# Patient Record
Sex: Female | Born: 1980
Health system: Southern US, Community
[De-identification: ages and names within clinical notes are randomized; demographics above are authoritative.]

## PROBLEM LIST (undated history)

## (undated) DIAGNOSIS — F329 Major depressive disorder, single episode, unspecified: Secondary | ICD-10-CM

## (undated) DIAGNOSIS — F53 Postpartum depression: Secondary | ICD-10-CM

## (undated) DIAGNOSIS — F32A Depression, unspecified: Secondary | ICD-10-CM

## (undated) DIAGNOSIS — O24419 Gestational diabetes mellitus in pregnancy, unspecified control: Secondary | ICD-10-CM

## (undated) DIAGNOSIS — Z9189 Other specified personal risk factors, not elsewhere classified: Secondary | ICD-10-CM

## (undated) DIAGNOSIS — O99345 Other mental disorders complicating the puerperium: Secondary | ICD-10-CM

## (undated) DIAGNOSIS — F1910 Other psychoactive substance abuse, uncomplicated: Secondary | ICD-10-CM

## (undated) DIAGNOSIS — E039 Hypothyroidism, unspecified: Secondary | ICD-10-CM

## (undated) DIAGNOSIS — Z5900 Homelessness unspecified: Secondary | ICD-10-CM

## (undated) DIAGNOSIS — Z62810 Personal history of physical and sexual abuse in childhood: Secondary | ICD-10-CM

## (undated) DIAGNOSIS — Z720 Tobacco use: Secondary | ICD-10-CM

## (undated) DIAGNOSIS — A599 Trichomoniasis, unspecified: Secondary | ICD-10-CM

## (undated) DIAGNOSIS — F1290 Cannabis use, unspecified, uncomplicated: Secondary | ICD-10-CM

## (undated) HISTORY — DX: Trichomoniasis, unspecified: A59.9

## (undated) HISTORY — DX: Hypothyroidism, unspecified: E03.9

## (undated) HISTORY — PX: NO PAST SURGERIES: SHX2092

## (undated) HISTORY — DX: Gestational diabetes mellitus in pregnancy, unspecified control: O24.419

---

## 2015-10-24 ENCOUNTER — Emergency Department (HOSPITAL_COMMUNITY)
Admission: EM | Admit: 2015-10-24 | Discharge: 2015-10-26 | Disposition: A | Discharge status: Other Acute Inpatient Hospital | Payer: Medicaid Other | Attending: Emergency Medicine | Admitting: Emergency Medicine

## 2015-10-24 DIAGNOSIS — Z3490 Encounter for supervision of normal pregnancy, unspecified, unspecified trimester: Secondary | ICD-10-CM

## 2015-10-24 DIAGNOSIS — R45851 Suicidal ideations: Secondary | ICD-10-CM

## 2015-10-24 DIAGNOSIS — Z3A11 11 weeks gestation of pregnancy: Secondary | ICD-10-CM | POA: Insufficient documentation

## 2015-10-24 DIAGNOSIS — F333 Major depressive disorder, recurrent, severe with psychotic symptoms: Secondary | ICD-10-CM | POA: Diagnosis present

## 2015-10-24 DIAGNOSIS — Z79899 Other long term (current) drug therapy: Secondary | ICD-10-CM | POA: Diagnosis not present

## 2015-10-24 DIAGNOSIS — O99341 Other mental disorders complicating pregnancy, first trimester: Secondary | ICD-10-CM | POA: Diagnosis not present

## 2015-10-24 DIAGNOSIS — F323 Major depressive disorder, single episode, severe with psychotic features: Secondary | ICD-10-CM | POA: Insufficient documentation

## 2015-10-24 DIAGNOSIS — F17200 Nicotine dependence, unspecified, uncomplicated: Secondary | ICD-10-CM | POA: Insufficient documentation

## 2015-10-24 HISTORY — DX: Major depressive disorder, single episode, unspecified: F32.9

## 2015-10-24 HISTORY — DX: Depression, unspecified: F32.A

## 2015-10-24 NOTE — ED Notes (Signed)
Bed: WTR5 Expected date:  Expected time:  Means of arrival:  Comments: 

## 2015-10-25 ENCOUNTER — Emergency Department (HOSPITAL_COMMUNITY): Payer: Medicaid Other

## 2015-10-25 ENCOUNTER — Encounter (HOSPITAL_COMMUNITY): Payer: Self-pay | Admitting: Emergency Medicine

## 2015-10-25 DIAGNOSIS — F333 Major depressive disorder, recurrent, severe with psychotic symptoms: Secondary | ICD-10-CM | POA: Diagnosis present

## 2015-10-25 LAB — COMPREHENSIVE METABOLIC PANEL
ALBUMIN: 3.9 g/dL (ref 3.5–5.0)
ALT: 25 U/L (ref 14–54)
AST: 40 U/L (ref 15–41)
Alkaline Phosphatase: 68 U/L (ref 38–126)
Anion gap: 8 (ref 5–15)
BILIRUBIN TOTAL: 0.7 mg/dL (ref 0.3–1.2)
BUN: 9 mg/dL (ref 6–20)
CO2: 23 mmol/L (ref 22–32)
CREATININE: 0.63 mg/dL (ref 0.44–1.00)
Calcium: 8.9 mg/dL (ref 8.9–10.3)
Chloride: 104 mmol/L (ref 101–111)
GFR calc Af Amer: >60 mL/min (ref >60)
GFR calc non Af Amer: >60 mL/min (ref >60)
GLUCOSE: 129 mg/dL — AB (ref 65–99)
POTASSIUM: 3 mmol/L — AB (ref 3.5–5.1)
Sodium: 135 mmol/L (ref 135–145)
TOTAL PROTEIN: 7.8 g/dL (ref 6.5–8.1)

## 2015-10-25 LAB — SALICYLATE LEVEL: Salicylate Lvl: <4 mg/dL (ref 2.8–30.0)

## 2015-10-25 LAB — CBC
HEMATOCRIT: 34.5 % — AB (ref 36.0–46.0)
Hemoglobin: 11.6 g/dL — ABNORMAL LOW (ref 12.0–15.0)
MCH: 29.1 pg (ref 26.0–34.0)
MCHC: 33.6 g/dL (ref 30.0–36.0)
MCV: 86.7 fL (ref 78.0–100.0)
Platelets: 267 10*3/uL (ref 150–400)
RBC: 3.98 MIL/uL (ref 3.87–5.11)
RDW: 12.7 % (ref 11.5–15.5)
WBC: 9.6 10*3/uL (ref 4.0–10.5)

## 2015-10-25 LAB — PREGNANCY, URINE: PREG TEST UR: POSITIVE — AB

## 2015-10-25 LAB — RAPID URINE DRUG SCREEN, HOSP PERFORMED
AMPHETAMINES: NOT DETECTED
BARBITURATES: NOT DETECTED
BENZODIAZEPINES: NOT DETECTED
Cocaine: NOT DETECTED
Opiates: NOT DETECTED
Tetrahydrocannabinol: POSITIVE — AB

## 2015-10-25 LAB — ACETAMINOPHEN LEVEL: Acetaminophen (Tylenol), Serum: <10 ug/mL — ABNORMAL LOW (ref 10–30)

## 2015-10-25 LAB — HCG, QUANTITATIVE, PREGNANCY: hCG, Beta Chain, Quant, S: 40380 m[IU]/mL — ABNORMAL HIGH (ref <5)

## 2015-10-25 LAB — ETHANOL: Alcohol, Ethyl (B): <5 mg/dL (ref <5)

## 2015-10-25 MED ORDER — ACETAMINOPHEN 325 MG PO TABS: 650.0000 mg | ORAL_TABLET | ORAL | Status: DC | PRN

## 2015-10-25 MED ORDER — HYDROXYZINE HCL 10 MG PO TABS
10.0000 mg | ORAL_TABLET | Freq: Three times a day (TID) | ORAL | Status: DC | PRN
  Filled 2015-10-25: qty 1

## 2015-10-25 MED ORDER — LORAZEPAM 1 MG PO TABS: 1.0000 mg | ORAL_TABLET | Freq: Three times a day (TID) | ORAL | Status: DC | PRN

## 2015-10-25 MED ORDER — NICOTINE 21 MG/24HR TD PT24: 21.0000 mg | MEDICATED_PATCH | Freq: Every day | TRANSDERMAL | Status: DC

## 2015-10-25 MED ORDER — FLUOXETINE HCL 10 MG PO CAPS
10.0000 mg | ORAL_CAPSULE | Freq: Every day | ORAL | Status: DC
  Administered 2015-10-25: 10 mg via ORAL
  Filled 2015-10-25: qty 1

## 2015-10-25 NOTE — Progress Notes (Signed)
Entered in d/c instructions Please use the resources provided to you in emergency room by case manager to assist you're your choice of doctor for follow up     These [place] uninsured resources provide possible primary care providers, resources for discounted medications, housing, dental resources, affordable care act information, plus other resources for [place]     Next Steps: Schedule an appointment as soon as possible for a visit today

## 2015-10-25 NOTE — ED Notes (Signed)
[facility] [employer] [facility]. Pt had a knife with her. The knife is locked up with security

## 2015-10-25 NOTE — Progress Notes (Addendum)
Pt was brought back from the main ER requesting gigngerale and crackers. Report to oncoming shift. (7;15pm)

## 2015-10-25 NOTE — Progress Notes (Signed)
Pt appears very anxious. She requested a sandwich and soda to drink. Pt has passive SI and remains with a sitter.She admitted she has been hearing voices telling her to hurt herself. Pt does contract for safety.

## 2015-10-25 NOTE — ED Provider Notes (Signed)
[facility]-EMERGENCY DEPT Provider Note   CSN: [id number] Arrival date & time: 10/24/15  2359     History   Chief Complaint Chief Complaint  Patient presents with  . Suicidal  . Medical Clearance    HPI Salley Hewsdonya Kulig is a 35 y.o. female.  The patient arrives from home by EMS asking for help for suicidal thoughts. She reports she becomes depressed and feels suicidal. No attempt to self harm today. She reports hearing and seeing hallucinations. No command voices. She reports being off her psychiatric medications for the past several months.    The history is provided by the patient. No language interpreter was used.    Past Medical History:  Diagnosis Date  . Depression     There are no active problems to display for this patient.   History reviewed. No pertinent surgical history.  OB History    No data available       Home Medications    Prior to Admission medications   Medication Sig Start Date End Date Taking? Authorizing Provider  PRESCRIPTION MEDICATION Patient states that she has not taken any medication in seven months or more. States that she was previously prescribed Zoloft 100 mg daily, Prozac 20 daily, Synthroid 100 mcg daily, metformin 500 twice daily, and trazodone 150 mg at bedtime.   Yes Historical Provider, MD    Family History No family history on file.  Social History Social History  Substance Use Topics  . Smoking status: Current Every Day Smoker  . Smokeless tobacco: Never Used  . Alcohol use No     Allergies   Review of patient's allergies indicates no known allergies.   Review of Systems Review of Systems  Constitutional: Negative for chills and fever.  HENT: Negative.   Respiratory: Negative.   Cardiovascular: Negative.   Gastrointestinal: Negative.   Musculoskeletal: Negative.   Skin: Negative.   Neurological: Negative.   Psychiatric/Behavioral: Positive for hallucinations and suicidal ideas.     Physical Exam Updated  Vital Signs BP 107/63 (BP Location: Right Arm)   Pulse 77   Temp 98.7 F (37.1 C) (Oral)   SpO2 99%   Physical Exam  Constitutional: She is oriented to person, place, and time. She appears well-developed and well-nourished.  Somnolent but easily arousable.  HENT:  Head: Normocephalic.  Neck: Normal range of motion. Neck supple.  Cardiovascular: Normal rate and regular rhythm.   Pulmonary/Chest: Effort normal and breath sounds normal.  Abdominal: Soft. Bowel sounds are normal. There is no tenderness. There is no rebound and no guarding.  Musculoskeletal: Normal range of motion.  Neurological: She is alert and oriented to person, place, and time.  Skin: Skin is warm and dry. No rash noted.  Psychiatric: Her speech is delayed. She is slowed and actively hallucinating. She exhibits a depressed mood. She expresses suicidal ideation.     ED Treatments / Results  Labs (all labs ordered are listed, but only abnormal results are displayed) Labs Reviewed  COMPREHENSIVE METABOLIC PANEL - Abnormal; Notable for the following:       Result Value   Potassium 3.0 (*)    Glucose, Bld 129 (*)    All other components within normal limits  ACETAMINOPHEN LEVEL - Abnormal; Notable for the following:    Acetaminophen (Tylenol), Serum <10 (*)    All other components within normal limits  CBC - Abnormal; Notable for the following:    Hemoglobin 11.6 (*)    HCT 34.5 (*)    All other  components within normal limits  URINE RAPID DRUG SCREEN, HOSP PERFORMED - Abnormal; Notable for the following:    Tetrahydrocannabinol POSITIVE (*)    All other components within normal limits  ETHANOL  SALICYLATE LEVEL  PREGNANCY, URINE    EKG  EKG Interpretation None       Radiology No results found.  Procedures Procedures (including critical care time)  Medications Ordered in ED Medications  nicotine (NICODERM CQ - dosed in mg/24 hours) patch 21 mg (not administered)  acetaminophen (TYLENOL)  tablet 650 mg (not administered)  LORazepam (ATIVAN) tablet 1 mg (not administered)     Initial Impression / Assessment and Plan / ED Course  I have reviewed the triage vital signs and the nursing notes.  Pertinent labs & imaging results that were available during my care of the patient were reviewed by me and considered in my medical decision making (see chart for details).  Clinical Course    Patient presents with SI and hallucinations. Noncompliant with medications. TTS consultation requested to evaluate for admission vs discharge.   Final Clinical Impressions(s) / ED Diagnoses   Final diagnoses:  None   1. SI 2. hallucinations New Prescriptions New Prescriptions   No medications on file     Elpidio Anis, Cordelia Poche 10/25/15 0257    Zadie Rhine, MD 10/25/15 [phone]

## 2015-10-25 NOTE — BH Assessment (Signed)
Referrals faxed to the following for review for possible inpt placement: [facility], [facility], [facility], [facility], [facility], [address], [address], [facility], [facility], [facility], [facility], [facility].  Princess BruinsAquicha Duff, MSW, Theresia MajorsLCSWA
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
## 2015-10-25 NOTE — ED Notes (Signed)
Security called & informed pt will transported by [facility] to [facility] & has a knife logged in with Security.  Per Security, knife will be given to [facility] upon their arrival to ED to transport with pt's other belongings.

## 2015-10-25 NOTE — BH Assessment (Signed)
[facility] by Dr. Jennet MaduroPucilowska. Patient reviewed and signed voluntary consent to treat to no one. Patient originally stated that she wanted to have her information released to a friend and then stated that she did not want it to be released and revoked the release and checked 'no." [facility]. Patients nurse informed of acceptance and paperwork being signed.   Davina PokeJoVea Jaryan Chicoine, LCSW Therapeutic Triage Specialist [facility] 10/25/2015 10:40 PM
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
## 2015-10-25 NOTE — BH Assessment (Addendum)
[facility] Assessment Progress Note  Per Thedore MinsMojeed Akintayo, MD, this pt requires psychiatric hospitalization at this time.  The following facilities have been contacted to seek placement for this pt, with results as noted:  Beds available, information sent, decision pending:  [facility]   At capacity:  [facility]  Additionally, [facility] call; per Kerry DoryLaurie Gardner, no beds are available at this time.  They do not anticipate having bed open over the next few days, and in fact, have a wait list.  At this time she does not recommend that this writer fax referral information.   Doylene Canninghomas Doyal Saric, MA Triage Specialist [phone]

## 2015-10-25 NOTE — ED Notes (Signed)
PA at bedside.

## 2015-10-25 NOTE — BH Assessment (Signed)
[facility], room [phone]. Please call report to [phone].

## 2015-10-25 NOTE — ED Notes (Signed)
Bed: WA30 Expected date:  Expected time:  Means of arrival:  Comments: 

## 2015-10-25 NOTE — Progress Notes (Signed)
  ED CM left pt uninsured [place] resources in his locker #33 CM spoke with pt who confirms uninsured [place] resident [place] pcp.  CM provided written information to assist pt with determining choice for uninsured accepting pcps, discussed the importance of pcp vs EDP services for f/u care, www.needymeds.org, www.goodrx.com, discounted pharmacies and other [place] resources such as [facility] , [facility], affordable care act, financial assistance, uninsured dental services, [place] med assist, DSS and  health department  Provided resources for [place] uninsured accepting pcps like [facility], family medicine at [address], [facility], [facility], local urgent care centers, [facility], [facility], general medical clinics, family services of the [place], [facility] urgent care plus others, medication resources, [facility] out patient pharmacies and housing Provided [employer] contact information

## 2015-10-25 NOTE — ED Notes (Signed)
Velna HatchetSheila from lab called about quantitative pregnancy lab.  She will run from urine that is in the lab.

## 2015-10-25 NOTE — ED Provider Notes (Signed)
Pt signed out to me at shift change pending Pelvic OB us. Pt has no pregnancy related complaints.   9:03 AM  US shows single intrauterine live pregnancy estimated at 11wks 0 days. Discussed results with pt. Continues to have no related symptoms. She is in NAD. Sleeping. Easily arousable. No abdominal tenderness. No vaginal discharge or bleeding. Cleared for psychiatric placement   Vitals:   10/25/15 0015 10/25/15 0633  BP: 107/63 114/67  Pulse: 77 (!) 56  Resp:  20  Temp: 98.7 F (37.1 C) 98.4 F (36.9 C)      Jaynie Crumbleatyana Zayed Griffie, PA-C 10/25/15 0913    Linwood DibblesJon Knapp, MD 10/27/15 [phone]

## 2015-10-25 NOTE — ED Notes (Signed)
[id number] -  Patient and belongings wanded by security.

## 2015-10-26 ENCOUNTER — Inpatient Hospital Stay
Admit: 2015-10-26 | Discharge: 2015-11-03 | DRG: 885 | Disposition: A | Discharge status: Home / Self Care | Payer: Medicaid Other | Attending: Psychiatry | Admitting: Psychiatry

## 2015-10-26 DIAGNOSIS — G4700 Insomnia, unspecified: Secondary | ICD-10-CM | POA: Diagnosis present

## 2015-10-26 DIAGNOSIS — Z3A00 Weeks of gestation of pregnancy not specified: Secondary | ICD-10-CM

## 2015-10-26 DIAGNOSIS — Z331 Pregnant state, incidental: Secondary | ICD-10-CM | POA: Diagnosis not present

## 2015-10-26 DIAGNOSIS — F17210 Nicotine dependence, cigarettes, uncomplicated: Secondary | ICD-10-CM | POA: Diagnosis present

## 2015-10-26 DIAGNOSIS — F4310 Post-traumatic stress disorder, unspecified: Secondary | ICD-10-CM | POA: Diagnosis present

## 2015-10-26 DIAGNOSIS — R45851 Suicidal ideations: Secondary | ICD-10-CM | POA: Diagnosis present

## 2015-10-26 DIAGNOSIS — O99320 Drug use complicating pregnancy, unspecified trimester: Secondary | ICD-10-CM | POA: Diagnosis present

## 2015-10-26 DIAGNOSIS — F1290 Cannabis use, unspecified, uncomplicated: Secondary | ICD-10-CM | POA: Diagnosis present

## 2015-10-26 DIAGNOSIS — Z590 Homelessness: Secondary | ICD-10-CM | POA: Diagnosis not present

## 2015-10-26 DIAGNOSIS — E039 Hypothyroidism, unspecified: Secondary | ICD-10-CM | POA: Diagnosis present

## 2015-10-26 DIAGNOSIS — Z79899 Other long term (current) drug therapy: Secondary | ICD-10-CM | POA: Diagnosis not present

## 2015-10-26 DIAGNOSIS — O99330 Smoking (tobacco) complicating pregnancy, unspecified trimester: Secondary | ICD-10-CM | POA: Diagnosis present

## 2015-10-26 DIAGNOSIS — F89 Unspecified disorder of psychological development: Secondary | ICD-10-CM | POA: Diagnosis present

## 2015-10-26 DIAGNOSIS — F333 Major depressive disorder, recurrent, severe with psychotic symptoms: Principal | ICD-10-CM | POA: Diagnosis present

## 2015-10-26 DIAGNOSIS — F1220 Cannabis dependence, uncomplicated: Secondary | ICD-10-CM | POA: Diagnosis present

## 2015-10-26 DIAGNOSIS — F17200 Nicotine dependence, unspecified, uncomplicated: Secondary | ICD-10-CM | POA: Diagnosis present

## 2015-10-26 HISTORY — DX: Suicidal ideations: R45.851

## 2015-10-26 LAB — TSH: TSH: 4.988 u[IU]/mL — ABNORMAL HIGH (ref 0.350–4.500)

## 2015-10-26 MED ORDER — ALUM & MAG HYDROXIDE-SIMETH 200-200-20 MG/5ML PO SUSP: 30.0000 mL | ORAL | Status: DC | PRN

## 2015-10-26 MED ORDER — PRENATAL PLUS 27-1 MG PO TABS
1.0000 | ORAL_TABLET | Freq: Every day | ORAL | Status: DC
  Administered 2015-10-26 – 2015-11-03 (×9): 1 via ORAL
  Filled 2015-10-26 (×9): qty 1

## 2015-10-26 MED ORDER — FOLIC ACID 1 MG PO TABS
2.0000 mg | ORAL_TABLET | Freq: Every day | ORAL | Status: DC
  Administered 2015-10-26 – 2015-11-03 (×9): 2 mg via ORAL
  Filled 2015-10-26 (×10): qty 2

## 2015-10-26 MED ORDER — ZOLPIDEM TARTRATE 5 MG PO TABS
5.0000 mg | ORAL_TABLET | Freq: Every evening | ORAL | Status: DC | PRN
Start: 2015-10-26 — End: 2015-11-03

## 2015-10-26 MED ORDER — ACETAMINOPHEN 325 MG PO TABS: 650.0000 mg | ORAL_TABLET | Freq: Four times a day (QID) | ORAL | Status: DC | PRN

## 2015-10-26 MED ORDER — MAGNESIUM HYDROXIDE 400 MG/5ML PO SUSP: 30.0000 mL | Freq: Every day | ORAL | Status: DC | PRN

## 2015-10-26 MED ORDER — LURASIDONE HCL 80 MG PO TABS
40.0000 mg | ORAL_TABLET | Freq: Every day | ORAL | Status: DC
  Administered 2015-10-26: 40 mg via ORAL
  Filled 2015-10-26: qty 1
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
## 2015-10-26 NOTE — Tx Team (Signed)
Initial Treatment Plan 10/26/2015 3:46 AM Mary HewsAdonya Richoux [id number]    PATIENT STRESSORS: Financial difficulties Marital or family conflict Medication change or noncompliance Substance abuse Traumatic event   PATIENT STRENGTHS: Wellsite geologistCommunication skills General fund of knowledge   PATIENT IDENTIFIED PROBLEMS: "I'm angry all the time"  Depression   Polysubstance abuse                  DISCHARGE CRITERIA:  Improved stabilization in mood, thinking, and/or behavior  PRELIMINARY DISCHARGE PLAN: Outpatient therapy  PATIENT/FAMILY INVOLVEMENT: This treatment plan has been presented to and reviewed with the patient, Mary Ramirez, and/or family member.  The patient and family have been given the opportunity to ask questions and make suggestions.  Lendell Capriceasey N Chon Buhl, RN 10/26/2015, 3:46 AM

## 2015-10-26 NOTE — Plan of Care (Signed)
Problem: Safety: Goal: Periods of time without injury will increase Outcome: Progressing Patient has remained free from injury this shift.  Patient provided with a safe environment.  Patient safety maintained with 15 minute checks.   

## 2015-10-26 NOTE — Progress Notes (Signed)
D:  Patient continues to endorse passive SI but verbally contracts for safety.  Patient denies AVH/HI.  Patient continues to be blunted and demanding towards staff. A:  Patient encouraged to attend group sessions.  Patient administered scheduled medications. R:  Patient safety maintained with 15 minute checks.  Patient educated on appropriate behaviors towards staff and peers.
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
## 2015-10-26 NOTE — [facility] Suicide Risk Assessment (Signed)
[place] INPATIENT:  Family/Significant Other Suicide Prevention Education  Suicide Prevention Education:  Education Completed; friend, Emogene Morgan [phone] has been identified by the patient as the family member/significant other with whom the patient will be residing, and identified as the person(s) who will aid the patient in the event of a mental health crisis (suicidal ideations/suicide attempt).  With written consent from the patient, the family member/significant other has been provided the following suicide prevention education, prior to the and/or following the discharge of the patient. Lucrezia Starch stated that she met pt in the park and has been providing her with food and clothes since. 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  The suicide prevention education provided includes the following:  Suicide risk factors  Suicide prevention and interventions  National Suicide Hotline telephone number  [facility] assessment telephone number  [facility] Emergency Assistance [place] and/or Residential Mobile Crisis Unit telephone number  Request made of family/significant other to:  Remove weapons (e.g., guns, rifles, knives), all items previously/currently identified as safety concern.    Remove drugs/medications (over-the-counter, prescriptions, illicit drugs), all items previously/currently identified as a safety concern.  The family member/significant other verbalizes understanding of the suicide prevention education information provided.  The family member/significant other agrees to remove the items of safety concern listed above.  Emilie Rutter, MSW, LCSW-A 10/26/2015, 2:03 PM

## 2015-10-26 NOTE — Tx Team (Signed)
Salley Hewsdonya Barillas  Pt name [id number]    Major depressive disorder, recurrent severe without psychotic features (HCC)  Principal Problem Principal Problem:   Major depressive disorder, recurrent severe without psychotic features (HCC) Active Problems:   Cannabis use disorder, moderate, dependence (HCC)   Suicidal ideation   Pregnant   Tobacco use disorder  Secondary Dx/Hospital Problem List Current Facility-Administered Medications  Medication Dose Route Frequency Provider Last Rate Last Dose  . acetaminophen (TYLENOL) tablet 650 mg  650 mg Oral Q6H PRN Jolanta B Pucilowska, MD      . alum & mag hydroxide-simeth (MAALOX/MYLANTA) 200-200-20 MG/5ML suspension 30 mL  30 mL Oral Q4H PRN Jolanta B Pucilowska, MD      . folic acid (FOLVITE) tablet 2 mg  2 mg Oral Daily Jolanta B Pucilowska, MD   2 mg at 10/26/15 0840  . magnesium hydroxide (MILK OF MAGNESIA) suspension 30 mL  30 mL Oral Daily PRN Jolanta B Pucilowska, MD      . prenatal vitamin w/FE, FA (PRENATAL 1 + 1) 27-1 MG tablet 1 tablet  1 tablet Oral Q1200 Jolanta B Pucilowska, MD   1 tablet at 10/26/15 1200  . zolpidem (AMBIEN) tablet 5 mg  5 mg Oral QHS PRN Shari ProwsJolanta B Pucilowska, MD         Current Meds Prescriptions Prior to Admission  Medication Sig Dispense Refill Last Dose  . PRESCRIPTION MEDICATION Patient states that she has not taken any medication in seven months or more. States that she was previously prescribed Zoloft 100 mg daily, Prozac 20 daily, Synthroid 100 mcg daily, metformin 500 twice daily, and trazodone 150 mg at bedtime.   more than 7 months ago     Prior to Admission Meds   Interdisciplinary Treatment and Diagnostic Plan Update  10/26/2015 Time of Session: 12:06 PM  Salley Hewsdonya Baucum MRN: [id number]  Principal Diagnosis: Major depressive disorder, recurrent severe without psychotic features (HCC)  Secondary Diagnoses: Principal Problem:   Major depressive disorder, recurrent severe without psychotic features  (HCC) Active Problems:   Cannabis use disorder, moderate, dependence (HCC)   Suicidal ideation   Pregnant   Tobacco use disorder   Current Medications:  Current Facility-Administered Medications  Medication Dose Route Frequency Provider Last Rate Last Dose  . acetaminophen (TYLENOL) tablet 650 mg  650 mg Oral Q6H PRN Jolanta B Pucilowska, MD      . alum & mag hydroxide-simeth (MAALOX/MYLANTA) 200-200-20 MG/5ML suspension 30 mL  30 mL Oral Q4H PRN Jolanta B Pucilowska, MD      . folic acid (FOLVITE) tablet 2 mg  2 mg Oral Daily Jolanta B Pucilowska, MD   2 mg at 10/26/15 0840  . magnesium hydroxide (MILK OF MAGNESIA) suspension 30 mL  30 mL Oral Daily PRN Jolanta B Pucilowska, MD      . prenatal vitamin w/FE, FA (PRENATAL 1 + 1) 27-1 MG tablet 1 tablet  1 tablet Oral Q1200 Jolanta B Pucilowska, MD   1 tablet at 10/26/15 1200  . zolpidem (AMBIEN) tablet 5 mg  5 mg Oral QHS PRN Shari ProwsJolanta B Pucilowska, MD        PTA Medications: Prescriptions Prior to Admission  Medication Sig Dispense Refill Last Dose  . PRESCRIPTION MEDICATION Patient states that she has not taken any medication in seven months or more. States that she was previously prescribed Zoloft 100 mg daily, Prozac 20 daily, Synthroid 100 mcg daily, metformin 500 twice daily, and trazodone 150 mg at bedtime.   more than 7  months ago    Treatment Modalities: Medication Management, Group therapy, Case management,  1 to 1 session with clinician, Psychoeducation, Recreational therapy.   Physician Treatment Plan for Primary Diagnosis: Major depressive disorder, recurrent severe without psychotic features (HCC) Long Term Goal(s): Improvement in symptoms so as ready for discharge  Short Term Goals: Ability to identify changes in lifestyle to reduce recurrence of condition will improve, Ability to verbalize feelings will improve, Ability to disclose and discuss suicidal ideas, Ability to demonstrate self-control will improve, Ability to  identify and develop effective coping behaviors will improve, Ability to maintain clinical measurements within normal limits will improve and Compliance with prescribed medications will improve  Medication Management: Evaluate patient's response, side effects, and tolerance of medication regimen.  Therapeutic Interventions: 1 to 1 sessions, Unit Group sessions and Medication administration.  Evaluation of Outcomes: Progressing  Physician Treatment Plan for Secondary Diagnosis: Principal Problem:   Major depressive disorder, recurrent severe without psychotic features (HCC) Active Problems:   Cannabis use disorder, moderate, dependence (HCC)   Suicidal ideation   Pregnant   Tobacco use disorder   Long Term Goal(s): Improvement in symptoms so as ready for discharge  Short Term Goals: Ability to identify changes in lifestyle to reduce recurrence of condition will improve, Ability to verbalize feelings will improve, Ability to disclose and discuss suicidal ideas, Ability to demonstrate self-control will improve, Ability to identify and develop effective coping behaviors will improve, Ability to maintain clinical measurements within normal limits will improve and Compliance with prescribed medications will improve  Medication Management: Evaluate patient's response, side effects, and tolerance of medication regimen.  Therapeutic Interventions: 1 to 1 sessions, Unit Group sessions and Medication administration.  Evaluation of Outcomes: Progressing   RN Treatment Plan for Primary Diagnosis: Major depressive disorder, recurrent severe without psychotic features (HCC) Long Term Goal(s): Knowledge of disease and therapeutic regimen to maintain health will improve  Short Term Goals: Ability to remain free from injury will improve, Ability to verbalize frustration and anger appropriately will improve, Ability to disclose and discuss suicidal ideas, Ability to identify and develop effective coping  behaviors will improve and Compliance with prescribed medications will improve  Medication Management: RN will administer medications as ordered by provider, will assess and evaluate patient's response and provide education to patient for prescribed medication. RN will report any adverse and/or side effects to prescribing provider.  Therapeutic Interventions: 1 on 1 counseling sessions, Psychoeducation, Medication administration, Evaluate responses to treatment, Monitor vital signs and CBGs as ordered, Perform/monitor CIWA, COWS, AIMS and Fall Risk screenings as ordered, Perform wound care treatments as ordered.  Evaluation of Outcomes: Progressing   LCSW Treatment Plan for Primary Diagnosis: Major depressive disorder, recurrent severe without psychotic features (HCC) Long Term Goal(s): Safe transition to appropriate next level of care at discharge, Engage patient in therapeutic group addressing interpersonal concerns.  Short Term Goals: Engage patient in aftercare planning with referrals and resources, Increase social support, Identify triggers associated with mental health/substance abuse issues and Increase skills for wellness and recovery  Therapeutic Interventions: Assess for all discharge needs, 1 to 1 time with Social worker, Explore available resources and support systems, Assess for adequacy in community support network, Educate family and significant other(s) on suicide prevention, Complete Psychosocial Assessment, Interpersonal group therapy.  Evaluation of Outcomes: Progressing   Progress in Treatment: Attending groups: Yes Participating in groups: Yes Taking medication as prescribed: Yes, MD continues to assess for medication changes as needed Toleration medication: Yes, no side effects reported  at this time Family/Significant other contact made:  Patient understands diagnosis:  Discussing patient identified problems/goals with staff: Yes Medical problems stabilized or  resolved: Yes Denies suicidal/homicidal ideation:  Issues/concerns per patient self-inventory: None Other: N/A  New problem(s) identified: None identified at this time.   New Short Term/Long Term Goal(s): None identified at this time.   Discharge Plan or Barriers:   Reason for Continuation of Hospitalization: Anxiety Delusions  Depression Hallucinations Homicidal ideation [place] [facility] Issues Medication stabilization Suicidal ideation Withdrawal symptoms  Estimated Length of Stay: 3-5 days  Attendees: Patient: Nigel Wessman  10/26/2015  12:06 PM  Physician: Dr. Kristine Linea, MD 10/26/2015  12:06 PM  Nursing: Hulan Amato, RN 10/26/2015  12:06 PM  RN Care Manager: Gilman Buttner  10/26/2015  12:06 PM  Social Worker: Hampton Abbot, MSW, LCSW-A 10/26/2015  12:06 PM  Recreational Therapist: Hershal Coria, LRT 10/26/2015  12:06 PM  Other: Jake Shark, LCSW 10/26/2015  12:06 PM  Other: York Grice, LCSW-A, LCAS 10/26/2015  12:06 PM  Other: 10/26/2015  12:06 PM    Scribe for Treatment Team: Lynden Oxford, MSW, LCSW-A

## 2015-10-26 NOTE — Progress Notes (Signed)
Recreation Therapy Notes  Date: 09.26.17 Time: 9:30 am Location: Craft Room  Group Topic: Self-expression  Goal Area(s) Addresses:  Patient will be able to identify a color that represents each emotion. Patient will verbalize benefit of using art as a means of self-expression. Patient will verbalize one emotion experienced while participating in activity.  Behavioral Response: Attentive, Interactive  Intervention: The Colors Within Me  Activity: Patients were given a blank face worksheet and were instructed to pick a color for each emotion they were feeling and show on the worksheet how much of that emotion they were feeling.  Education: LRT educated patients on different forms of self-expression.  Education Outcome: In group clarification offered  Clinical Observations/Feedback: Patient completed activity by picking colors for each emotion she felt and showing how much of that emotion she felt. Patient contributed to group discussion by stating how her emotions affect her treatment in the hospital, that her emotions are dynamic and what changes them, and how she sees her emotions changing when she is d/c. Towards the end of group, patient was focused on how her past and being homeless affect her emotions, but it wasn't her fault with the situation that she is in.  Jacquelynn CreeGreene,Joslyne Marshburn M, LRT/CTRS 10/26/2015 10:22 AM

## 2015-10-26 NOTE — ED Notes (Signed)
[place] arrived on unit.

## 2015-10-26 NOTE — [facility] Group Notes (Signed)
[facility] LCSW Group Therapy   10/26/2015 1pm  Type of Therapy: Group Therapy   Participation Level: Pt invited but did not attend.   Participation Quality: Pt invited but did not attend.    Hampton AbbotKadijah Broderick Fonseca, MSW, LCSWA 10/26/2015, 2:48PM
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
## 2015-10-27 LAB — LIPID PANEL
CHOL/HDL RATIO: 2.7 ratio
CHOLESTEROL: 107 mg/dL (ref 0–200)
HDL: 39 mg/dL — AB (ref >40)
LDL Cholesterol: 50 mg/dL (ref 0–99)
TRIGLYCERIDES: 88 mg/dL (ref <150)
VLDL: 18 mg/dL (ref 0–40)

## 2015-10-27 MED ORDER — SERTRALINE HCL 25 MG PO TABS
50.0000 mg | ORAL_TABLET | Freq: Every day | ORAL | Status: DC
  Administered 2015-10-27 – 2015-11-03 (×8): 50 mg via ORAL
  Filled 2015-10-27 (×8): qty 2

## 2015-10-27 MED ORDER — LEVOTHYROXINE SODIUM 100 MCG PO TABS
100.0000 ug | ORAL_TABLET | Freq: Every day | ORAL | Status: DC
  Administered 2015-10-28 – 2015-11-03 (×7): 100 ug via ORAL
  Filled 2015-10-27 (×8): qty 1

## 2015-10-27 MED ORDER — LURASIDONE HCL 80 MG PO TABS
80.0000 mg | ORAL_TABLET | Freq: Every day | ORAL | Status: DC
  Administered 2015-10-27 – 2015-11-03 (×8): 80 mg via ORAL
  Filled 2015-10-27 (×8): qty 1

## 2015-10-27 NOTE — Plan of Care (Signed)
Problem: Coping: Goal: Ability to verbalize feelings will improve Outcome: Not Progressing Pt is guarded and maintains minimal interaction. Forwards little information regarding feelings at this time.  Problem: Safety: Goal: Periods of time without injury will increase Outcome: Progressing Pt remains free from harm.

## 2015-10-27 NOTE — Progress Notes (Signed)
Recreation Therapy Notes  Date: 09.27.17 Time: 9:30 am Location: Craft Room  Group Topic: Self-esteem  Goal Area(s) Addresses:  Patient will write positive traits about self. Patient will verbalize benefit of having a healthy self-esteem.  Behavioral Response: Attentive, Interactive  Intervention: I Am  Activity: Patients were given a worksheet with the letter I on it and were instructed to write as many positive traits about themselves inside the letter.  Education: LRT educated patients on ways they can increase their self-esteem.  Education Outcome: Acknowledges education/In group clarification offered  Clinical Observations/Feedback: Patient wrote positive traits about self. Patient contributed to group discussion by stating it was difficult to think of positive traits and why.   Jacquelynn CreeGreene,Shaton Lore M, LRT/CTRS 10/27/2015 10:34 AM

## 2015-10-27 NOTE — Progress Notes (Signed)
Mary Ramirez Progress Note  10/27/2015 2:33 PM Mary Ramirez  MRN:  [id number]  Subjective:  Mary Ramirez continues to be psychotic, disorganized, hallucinating, unreasonable. She does not want any help with substance abuse treatment or housing and asks to return to the streets of [place]. She is secluded to her room. Minimal eye contac or interaction.  Principal Problem: Severe recurrent major depressive disorder with psychotic features (HCC) Diagnosis:   Patient Active Problem List   Diagnosis Date Noted  . Cannabis use disorder, moderate, dependence (HCC) [F12.20] 10/26/2015  . Suicidal ideation [R45.851] 10/26/2015  . Pregnant [Z33.1] 10/26/2015  . Tobacco use disorder [F17.200] 10/26/2015  . Undifferentiated schizophrenia (HCC) [F20.3] 10/26/2015  . Hypothyroidism [E03.9] 10/26/2015  . 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  Past Medical History:  Past Medical History:  Diagnosis Date  . Depression    History reviewed. No pertinent surgical history. Family History: History reviewed. No pertinent family history. Family Psychiatric  History: see H&P. Social History:  History  Alcohol Use  . Yes     History  Drug Use  . Types: Marijuana, Cocaine, "Crack" cocaine, MDMA (Ecstacy)    Social History   Social History  . Marital status: Single    Spouse name: N/A  . Number of children: N/A  . Years of education: N/A   Social History Main Topics  . Smoking status: Current Every Day Smoker    Years: 1.00  . Smokeless tobacco: Never Used  . Alcohol use Yes  . Drug use:     Types: Marijuana, Cocaine, "Crack" cocaine, MDMA (Ecstacy)  . Sexual activity: Not Asked   Other Topics Concern  . None   Social History Narrative  . None   Additional Social History:                         Sleep:  Fair  Appetite:  Fair  Current Medications: Current Facility-Administered Medications  Medication Dose Route Frequency Provider Last Rate Last Dose  . acetaminophen (TYLENOL) tablet 650 mg  650 mg Oral Q6H PRN Evander Macaraeg B Makaiya Geerdes, Ramirez      . alum & mag hydroxide-simeth (MAALOX/MYLANTA) 200-200-20 MG/5ML suspension 30 mL  30 mL Oral Q4H PRN Hooria Gasparini B Torris House, Ramirez      . folic acid (FOLVITE) tablet 2 mg  2 mg Oral Daily Shari Prows, Ramirez   2 mg at 10/27/15 0807  . [START ON 10/28/2015] levothyroxine (SYNTHROID, LEVOTHROID) tablet 100 mcg  100 mcg Oral QAC breakfast Kabria Hetzer B Katerra Ingman, Ramirez      . lurasidone (LATUDA) tablet 80 mg  80 mg Oral Q supper Denzal Meir B Byard Carranza, Ramirez      . magnesium hydroxide (MILK OF MAGNESIA) suspension 30 mL  30 mL Oral Daily PRN Man Effertz B Brenly Trawick, Ramirez      . prenatal vitamin w/FE, FA (PRENATAL 1 + 1) 27-1 MG tablet 1 tablet  1 tablet Oral Q1200 Jaella Weinert B Alden Feagan, Ramirez   1 tablet at 10/26/15 1200  . sertraline (ZOLOFT) tablet 50 mg  50 mg Oral Daily Melena Hayes B Damoney Julia, Ramirez      . zolpidem (AMBIEN) tablet 5 mg  5 mg Oral QHS PRN Shari Prows, Ramirez        Lab Results:  Results for orders placed or performed during the hospital encounter of 10/26/15 (from the  past 48 hour(s))  TSH     Status: Abnormal   Collection Time: 10/26/15  2:58 PM  Result Value Ref Range   TSH 4.988 (H) 0.350 - 4.500 uIU/mL  Lipid panel     Status: Abnormal   Collection Time: 10/27/15  6:27 AM  Result Value Ref Range   Cholesterol 107 0 - 200 mg/dL   Triglycerides 88 <161<150 mg/dL   HDL 39 (L) >09>40 mg/dL   Total CHOL/HDL Ratio 2.7 RATIO   VLDL 18 0 - 40 mg/dL   LDL Cholesterol 50 0 - 99 mg/dL    Comment:        Total Cholesterol/HDL:CHD Risk Coronary Heart Disease Risk Table                     Men   Women  1/2 Average Risk   3.4   3.3  Average Risk       5.0   4.4  2 X Average Risk   9.6   7.1  3 X Average Risk  23.4   11.0        Use the calculated Patient  Ratio above and the CHD Risk Table to determine the patient's CHD Risk.        ATP III CLASSIFICATION (LDL):  <100     mg/dL   Optimal  [id number]  mg/dL   Near or Above                    Optimal  130-159  mg/dL   Borderline  [id number]  mg/dL   High  >478>190     mg/dL   Very High     Blood Alcohol level:  Lab Results  Component Value Date   ETH <5 10/25/2015    Metabolic Disorder Labs: No results found for: HGBA1C, MPG No results found for: PROLACTIN Lab Results  Component Value Date   CHOL 107 10/27/2015   TRIG 88 10/27/2015   HDL 39 (L) 10/27/2015   CHOLHDL 2.7 10/27/2015   VLDL 18 10/27/2015   LDLCALC 50 10/27/2015    Physical Findings: AIMS:  , ,  ,  ,    CIWA:    COWS:     Musculoskeletal: Strength & Muscle Tone: within normal limits Gait & Station: normal Patient leans: N/A  Psychiatric Specialty Exam: Physical Exam  Nursing note and vitals reviewed.   Review of Systems  Psychiatric/Behavioral: Positive for depression, hallucinations, substance abuse and suicidal ideas. The patient has insomnia.     Blood pressure 95/61, pulse 69, temperature 98.4 F (36.9 C), temperature source Oral, resp. rate 18, height 5\' 4"  (1.626 m), weight 73.9 kg (163 lb), SpO2 100 %.Body mass index is 27.98 kg/m.  General Appearance: Casual  Eye Contact:  Minimal  Speech:  Slow  Volume:  Decreased  Mood:  Depressed, Hopeless and Worthless  Affect:  Blunt  Thought Process:  Disorganized and Descriptions of Associations: Tangential  Orientation:  Full (Time, Place, and Person)  Thought Content:  Delusions, Hallucinations: Auditory and Paranoid Ideation  Suicidal Thoughts:  No  Homicidal Thoughts:  No  Memory:  Immediate;   Fair Recent;   Fair Remote;   Fair  Judgement:  Poor  Insight:  Lacking  Psychomotor Activity:  Psychomotor Retardation  Concentration:  Concentration: Fair and Attention Span: Fair  Recall:  [employer]Fair  Fund of Knowledge:  Fair  Language:  Fair  Akathisia:   No  Handed:  Right  AIMS (if  indicated):     Assets:  Communication Skills Desire for Improvement Physical Health Resilience Social Support  ADL's:  Intact  Cognition:  WNL  Sleep:  Number of Hours: 8.25     Treatment Plan Summary: Daily contact with patient to assess and evaluate symptoms and progress in treatment and Medication management    Mary Ramirez is a 35 year old pregnant female with a history of depression, psychosis, suicide attempts and substance abuse admitted for hallucinations, suicidal ideation and depression.  1. Suicidal ideation. The patient is able to contract for safety in the hospital.   2. Mood and psychosis. The patient is pregnant. We start Latuda for psychosis. I will increase dose to 80 mg today. We will add Zoloft to her regimen at her insistence.   3. Substance abuse. There is a vast history of substance abuse. She would benefit from [facility] program but so far refuses.  4. Pregnancy. We start prenatal vitamins and folic acid. We'll ultrasound performed recently in the emergency room.  5. Insomnia. She slept better with Ambien 5 mg.  6. Hypothyroidism. TSH is slightly elevated at 5. Will start Synthroid 100 mg.  7. Metabolic syndrome monitoring. Lipid profile is normal. HgbA1C is pending.  8. EKG.  9. Disposition. To be established.   Kristine Linea, Ramirez 10/27/2015, 2:33 PM

## 2015-10-27 NOTE — Progress Notes (Signed)
D: Pt is seen in the milieu this evening. Minimal interaction noted. Pt states "I just have a lot on my mind." She refuses to elaborate. Pt is guarded with interaction. Denies SI/HI/AVH at this time. A: Emotional support and encouragement provided. Pt encouraged to attend and participate in groups. q15 minute safety checks maintained. R: Pt remains free from harm. Will continue to monitor.

## 2015-10-27 NOTE — Progress Notes (Signed)
Patient with sad affect, cooperative behavior with meals, meds and plan of care. No SI/HI at this time. Quiet with peers. Verbalizes needs appropriately to staff. Therapy groups encouraged. Safety maintained.

## 2015-10-27 NOTE — [facility] Group Notes (Signed)
[facility] LCSW Group Therapy   10/27/2015  1:00 pm   Type of Therapy: Group Therapy   Participation Level: Active   Participation Quality: Attentive, Sharing and Supportive   Affect: Flat   Cognitive: Alert and Oriented   Insight: Developing/Improving and Engaged   Engagement in Therapy: Developing/Improving and Engaged   Modes of Intervention: Clarification, Confrontation, Discussion, Education, Exploration, Limit-setting, Orientation, Problem-solving, Rapport Building, Dance movement psychotherapisteality Testing, Socialization and Support   Summary of Progress/Problems: The topic for group today was emotional regulation. This group focused on both positive and negative emotion identification and allowed  group members to process ways to identify feelings, regulate negative emotions, and find healthy ways to manage internal/external emotions. Group members were asked to reflect on a time when their reaction to an emotion led to a negative outcome and explored how alternative responses using emotion regulation would have benefited them. Group members were also asked to discuss a time when emotion regulation was utilized when a negative emotion was experienced. Pt participated minimally in group discussion. She was able to define emotion regulation as an "expression." She stated that she will benefit from drawing and expressing her emotions through pictures and coloring.     Mary AbbotKadijah Joni Norrod, MSW, LCSWA 10/27/2015, 2:19PM

## 2015-10-28 LAB — HEMOGLOBIN A1C
Hgb A1c MFr Bld: 5.7 % — ABNORMAL HIGH (ref 4.8–5.6)
Mean Plasma Glucose: 117 mg/dL

## 2015-10-28 NOTE — [facility] Group Notes (Signed)
[facility] Group Notes:  (Nursing/MHT/Case Management/Adjunct)  Date:  10/28/2015  Time:  11:13 PM  Type of Therapy:  Group Therapy  Participation Level:  Did Not Attend    Veva Holesshley Imani Zimal Weisensel 10/28/2015, 11:13 PM

## 2015-10-28 NOTE — Tx Team (Signed)
Mary Ramirez  Pt name [id number]    Severe recurrent major depressive disorder with psychotic features [facility](HCC)  Principal Problem Principal Problem:   Severe recurrent major depressive disorder with psychotic features (HCC) Active Problems:   Cannabis use disorder, moderate, dependence (HCC)   Suicidal ideation   Pregnant   Tobacco use disorder   Undifferentiated schizophrenia (HCC)   Hypothyroidism  Secondary Dx/Hospital Problem List Current Facility-Administered Medications  Medication Dose Route Frequency Provider Last Rate Last Dose  . acetaminophen (TYLENOL) tablet 650 mg  650 mg Oral Q6H PRN Jolanta B Pucilowska, MD      . alum & mag hydroxide-simeth (MAALOX/MYLANTA) 200-200-20 MG/5ML suspension 30 mL  30 mL Oral Q4H PRN Jolanta B Pucilowska, MD      . folic acid (FOLVITE) tablet 2 mg  2 mg Oral Daily Shari ProwsJolanta B Pucilowska, MD   2 mg at 10/28/15 0813  . levothyroxine (SYNTHROID, LEVOTHROID) tablet 100 mcg  100 mcg Oral QAC breakfast Shari ProwsJolanta B Pucilowska, MD   100 mcg at 10/28/15 0641  . lurasidone (LATUDA) tablet 80 mg  80 mg Oral Q supper Shari ProwsJolanta B Pucilowska, MD   80 mg at 10/27/15 1735  . magnesium hydroxide (MILK OF MAGNESIA) suspension 30 mL  30 mL Oral Daily PRN Jolanta B Pucilowska, MD      . prenatal vitamin w/FE, FA (PRENATAL 1 + 1) 27-1 MG tablet 1 tablet  1 tablet Oral Q1200 Jolanta B Pucilowska, MD   1 tablet at 10/28/15 1102  . sertraline (ZOLOFT) tablet 50 mg  50 mg Oral Daily Jolanta B Pucilowska, MD   50 mg at 10/28/15 0813  . zolpidem (AMBIEN) tablet 5 mg  5 mg Oral QHS PRN Shari ProwsJolanta B Pucilowska, MD         Current Meds Prescriptions Prior to Admission  Medication Sig Dispense Refill Last Dose  . PRESCRIPTION MEDICATION Patient states that she has not taken any medication in seven months or more. States that she was previously prescribed Zoloft 100 mg daily, Prozac 20 daily, Synthroid 100 mcg daily, metformin 500 twice daily, and trazodone 150 mg at bedtime.   more  than 7 months ago     Prior to Admission Meds   Interdisciplinary Treatment and Diagnostic Plan Update  10/28/2015 Time of Session: 11:07 AM  Mary Ramirez MRN: [id number]  Principal Diagnosis: Severe recurrent major depressive disorder with psychotic features (HCC)  Secondary Diagnoses: Principal Problem:   Severe recurrent major depressive disorder with psychotic features (HCC) Active Problems:   Cannabis use disorder, moderate, dependence (HCC)   Suicidal ideation   Pregnant   Tobacco use disorder   Undifferentiated schizophrenia (HCC)   Hypothyroidism   Current Medications:  Current Facility-Administered Medications  Medication Dose Route Frequency Provider Last Rate Last Dose  . acetaminophen (TYLENOL) tablet 650 mg  650 mg Oral Q6H PRN Jolanta B Pucilowska, MD      . alum & mag hydroxide-simeth (MAALOX/MYLANTA) 200-200-20 MG/5ML suspension 30 mL  30 mL Oral Q4H PRN Jolanta B Pucilowska, MD      . folic acid (FOLVITE) tablet 2 mg  2 mg Oral Daily Shari ProwsJolanta B Pucilowska, MD   2 mg at 10/28/15 0813  . levothyroxine (SYNTHROID, LEVOTHROID) tablet 100 mcg  100 mcg Oral QAC breakfast Shari ProwsJolanta B Pucilowska, MD   100 mcg at 10/28/15 0641  . lurasidone (LATUDA) tablet 80 mg  80 mg Oral Q supper Shari ProwsJolanta B Pucilowska, MD   80 mg at 10/27/15 1735  . magnesium hydroxide (MILK  OF MAGNESIA) suspension 30 mL  30 mL Oral Daily PRN Jolanta B Pucilowska, MD      . prenatal vitamin w/FE, FA (PRENATAL 1 + 1) 27-1 MG tablet 1 tablet  1 tablet Oral Q1200 Jolanta B Pucilowska, MD   1 tablet at 10/28/15 1102  . sertraline (ZOLOFT) tablet 50 mg  50 mg Oral Daily Jolanta B Pucilowska, MD   50 mg at 10/28/15 0813  . zolpidem (AMBIEN) tablet 5 mg  5 mg Oral QHS PRN Shari Prows, MD        PTA Medications: Prescriptions Prior to Admission  Medication Sig Dispense Refill Last Dose  . PRESCRIPTION MEDICATION Patient states that she has not taken any medication in seven months or more. States that  she was previously prescribed Zoloft 100 mg daily, Prozac 20 daily, Synthroid 100 mcg daily, metformin 500 twice daily, and trazodone 150 mg at bedtime.   more than 7 months ago    Treatment Modalities: Medication Management, Group therapy, Case management,  1 to 1 session with clinician, Psychoeducation, Recreational therapy.   Physician Treatment Plan for Primary Diagnosis: Severe recurrent major depressive disorder with psychotic features (HCC) Long Term Goal(s): Improvement in symptoms so as ready for discharge  Short Term Goals: Ability to identify changes in lifestyle to reduce recurrence of condition will improve, Ability to verbalize feelings will improve, Ability to disclose and discuss suicidal ideas, Ability to demonstrate self-control will improve, Ability to identify and develop effective coping behaviors will improve, Ability to maintain clinical measurements within normal limits will improve and Compliance with prescribed medications will improve  Medication Management: Evaluate patient's response, side effects, and tolerance of medication regimen.  Therapeutic Interventions: 1 to 1 sessions, Unit Group sessions and Medication administration.  Evaluation of Outcomes: Progressing  Physician Treatment Plan for Secondary Diagnosis: Principal Problem:   Severe recurrent major depressive disorder with psychotic features (HCC) Active Problems:   Cannabis use disorder, moderate, dependence (HCC)   Suicidal ideation   Pregnant   Tobacco use disorder   Undifferentiated schizophrenia (HCC)   Hypothyroidism   Long Term Goal(s): Improvement in symptoms so as ready for discharge  Short Term Goals: Ability to identify changes in lifestyle to reduce recurrence of condition will improve, Ability to verbalize feelings will improve, Ability to disclose and discuss suicidal ideas, Ability to demonstrate self-control will improve, Ability to identify and develop effective coping behaviors  will improve, Ability to maintain clinical measurements within normal limits will improve and Compliance with prescribed medications will improve  Medication Management: Evaluate patient's response, side effects, and tolerance of medication regimen.  Therapeutic Interventions: 1 to 1 sessions, Unit Group sessions and Medication administration.  Evaluation of Outcomes: Progressing   RN Treatment Plan for Primary Diagnosis: Severe recurrent major depressive disorder with psychotic features (HCC) Long Term Goal(s): Knowledge of disease and therapeutic regimen to maintain health will improve  Short Term Goals: Ability to remain free from injury will improve, Ability to verbalize frustration and anger appropriately will improve, Ability to disclose and discuss suicidal ideas, Ability to identify and develop effective coping behaviors will improve and Compliance with prescribed medications will improve  Medication Management: RN will administer medications as ordered by provider, will assess and evaluate patient's response and provide education to patient for prescribed medication. RN will report any adverse and/or side effects to prescribing provider.  Therapeutic Interventions: 1 on 1 counseling sessions, Psychoeducation, Medication administration, Evaluate responses to treatment, Monitor vital signs and CBGs as ordered, Perform/monitor CIWA,  COWS, AIMS and Fall Risk screenings as ordered, Perform wound care treatments as ordered.  Evaluation of Outcomes: Progressing   LCSW Treatment Plan for Primary Diagnosis: Severe recurrent major depressive disorder with psychotic features (HCC) Long Term Goal(s): Safe transition to appropriate next level of care at discharge, Engage patient in therapeutic group addressing interpersonal concerns.  Short Term Goals: Engage patient in aftercare planning with referrals and resources, Increase social support, Identify triggers associated with mental  health/substance abuse issues and Increase skills for wellness and recovery  Therapeutic Interventions: Assess for all discharge needs, 1 to 1 time with Social worker, Explore available resources and support systems, Assess for adequacy in community support network, Educate family and significant other(s) on suicide prevention, Complete Psychosocial Assessment, Interpersonal group therapy.  Evaluation of Outcomes: Progressing   Progress in Treatment: Attending groups: Yes Participating in groups: Yes Taking medication as prescribed: Yes, MD continues to assess for medication changes as needed Toleration medication: Yes, no side effects reported at this time Family/Significant other contact made: Yes, CSW contacted friend, Jamelle Haring Patient understands diagnosis:  Discussing patient identified problems/goals with staff: Yes Medical problems stabilized or resolved: Yes Denies suicidal/homicidal ideation:  Issues/concerns per patient self-inventory: None Other: N/A  New problem(s) identified: None identified at this time.   New Short Term/Long Term Goal(s): None identified at this time.   Discharge Plan or Barriers:   Reason for Continuation of Hospitalization: Anxiety Delusions  Depression Hallucinations Homicidal ideation [place] Medical Issues Medication stabilization Suicidal ideation Withdrawal symptoms  Estimated Length of Stay: 3-5 days  Attendees: Patient: Mary Ramirez  10/28/2015  11:07 AM  Physician: Dr. Kristine Linea, MD 10/28/2015  11:07 AM  Nursing: Hulan Amato, RN 10/28/2015  11:07 AM  RN Care Manager:  10/28/2015  11:07 AM  Social Worker: Hampton Abbot, MSW, LCSW-A 10/28/2015  11:07 AM  Recreational Therapist: Hershal Coria, LRT 10/28/2015  11:07 AM  Other:  10/28/2015  11:07 AM  Other:  10/28/2015  11:07 AM  Other: 10/28/2015  11:07 AM    Scribe for Treatment Team: Lynden Oxford, MSW, LCSW-A

## 2015-10-28 NOTE — Progress Notes (Signed)
D: Observed pt in room lying in bed. Patient alert and oriented x4. Patient denies SI/HI/AVH. Pt affect is blunted. Pt isolated to room all evening, coming out only for snack. Pt appeared guarded and forwarded very little to Clinical research associatewriter. Pt denied auditory hallucinations, bu indicated she had them "when I first came in." Pt rated depression 6/10 and denied anxiety.  A: Offered active listening and support. Provided therapeutic communication. Encouraged pt to attend group and actively participate in care.  R: Pt pleasant and cooperative. Pt had no medications scheduled this evening. Will continue Q15 min. checks. Safety maintained.

## 2015-10-28 NOTE — Progress Notes (Addendum)
[facility] MD Progress Note  10/28/2015 12:22 PM Mary Ramirez  MRN:  [id number]  Subjective:  There is some improvement. Mary Ramirez is out of her room today. She does not interact with peers or staff. She appears to be attending to internal stimuli today again. She is still suicidl. She is not able to participate in discharge planning. She does not want any help with substance abuse treatment or housing and asks to be returned to the streets of [place] where she has been living for the past two years. She is pregnant now. She accepts medications and tolerates them well. No somatic complaints.  Principal Problem: Severe recurrent major depressive disorder with psychotic features (HCC) Diagnosis:   Patient Active Problem List   Diagnosis Date Noted  . Cannabis use disorder, moderate, dependence (HCC) [F12.20] 10/26/2015  . Suicidal ideation [R45.851] 10/26/2015  . Pregnant [Z33.1] 10/26/2015  . Tobacco use disorder [F17.200] 10/26/2015  . Undifferentiated schizophrenia (HCC) [F20.3] 10/26/2015  . Hypothyroidism [E03.9] 10/26/2015  . 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  Past Medical History:  Past Medical History:  Diagnosis Date  . Depression    History reviewed. No pertinent surgical history. Family History: History reviewed. No pertinent family history. Family Psychiatric  History: see H&P. Social History:  History  Alcohol Use  . Yes     History  Drug Use  . Types: Marijuana, Cocaine, "Crack" cocaine, MDMA (Ecstacy)    Social History   Social History  . Marital status: Single    Spouse name: N/A  . Number of children: N/A  . Years of education: N/A   Social History Main Topics  . Smoking status: Current Every Day Smoker    Years: 1.00  . Smokeless tobacco: Never Used  . Alcohol use Yes  . Drug use:      Types: Marijuana, Cocaine, "Crack" cocaine, MDMA (Ecstacy)  . Sexual activity: Not Asked   Other Topics Concern  . None   Social History Narrative  . None   Additional Social History:                         Sleep: Fair  Appetite:  Fair  Current Medications: Current Facility-Administered Medications  Medication Dose Route Frequency Provider Last Rate Last Dose  . acetaminophen (TYLENOL) tablet 650 mg  650 mg Oral Q6H PRN Saniyah Mondesir B Omaree Fuqua, MD      . alum & mag hydroxide-simeth (MAALOX/MYLANTA) 200-200-20 MG/5ML suspension 30 mL  30 mL Oral Q4H PRN Rikayla Demmon B Chyna Kneece, MD      . folic acid (FOLVITE) tablet 2 mg  2 mg Oral Daily Shari Prows, MD   2 mg at 10/28/15 0813  . levothyroxine (SYNTHROID, LEVOTHROID) tablet 100 mcg  100 mcg Oral QAC breakfast Shari Prows, MD   100 mcg at 10/28/15 0641  . lurasidone (LATUDA) tablet 80 mg  80 mg Oral Q supper Shari Prows, MD   80 mg at 10/27/15 1735  . magnesium hydroxide (MILK OF MAGNESIA) suspension 30 mL  30 mL Oral Daily PRN Renso Swett B Rayhan Groleau, MD      . prenatal vitamin w/FE, FA (PRENATAL 1 + 1) 27-1 MG tablet 1 tablet  1 tablet Oral Q1200 Kenyah Luba B Kunio Cummiskey, MD   1 tablet at 10/28/15 1102  . sertraline (ZOLOFT) tablet 50 mg  50 mg  Oral Daily Shari ProwsJolanta B Janzen Sacks, MD   50 mg at 10/28/15 0813  . zolpidem (AMBIEN) tablet 5 mg  5 mg Oral QHS PRN Shari ProwsJolanta B Isidor Bromell, MD        Lab Results:  Results for orders placed or performed during the hospital encounter of 10/26/15 (from the past 48 hour(s))  TSH     Status: Abnormal   Collection Time: 10/26/15  2:58 PM  Result Value Ref Range   TSH 4.988 (H) 0.350 - 4.500 uIU/mL  Lipid panel     Status: Abnormal   Collection Time: 10/27/15  6:27 AM  Result Value Ref Range   Cholesterol 107 0 - 200 mg/dL   Triglycerides 88 <161<150 mg/dL   HDL 39 (L) >09>40 mg/dL   Total CHOL/HDL Ratio 2.7 RATIO   VLDL 18 0 - 40 mg/dL   LDL Cholesterol 50 0 - 99 mg/dL     Comment:        Total Cholesterol/HDL:CHD Risk Coronary Heart Disease Risk Table                     Men   Women  1/2 Average Risk   3.4   3.3  Average Risk       5.0   4.4  2 X Average Risk   9.6   7.1  3 X Average Risk  23.4   11.0        Use the calculated Patient Ratio above and the CHD Risk Table to determine the patient's CHD Risk.        ATP III CLASSIFICATION (LDL):  <100     mg/dL   Optimal  [id number]  mg/dL   Near or Above                    Optimal  130-159  mg/dL   Borderline  [id number]  mg/dL   High  >478>190     mg/dL   Very High   Hemoglobin A1c     Status: Abnormal   Collection Time: 10/27/15  6:27 AM  Result Value Ref Range   Hgb A1c MFr Bld 5.7 (H) 4.8 - 5.6 %    Comment: (NOTE)         Pre-diabetes: 5.7 - 6.4         Diabetes: >6.4         Glycemic control for adults with diabetes: <7.0    Mean Plasma Glucose 117 mg/dL    Comment: (NOTE) Performed At: [facility] [address] [place], [place] [id number] Mila HomerHancock William F MD MV:[phone]:[phone]     Blood Alcohol level:  Lab Results  Component Value Date   [facility] <5 10/25/2015    Metabolic Disorder Labs: Lab Results  Component Value Date   HGBA1C 5.7 (H) 10/27/2015   MPG 117 10/27/2015   No results found for: PROLACTIN Lab Results  Component Value Date   CHOL 107 10/27/2015   TRIG 88 10/27/2015   HDL 39 (L) 10/27/2015   CHOLHDL 2.7 10/27/2015   VLDL 18 10/27/2015   LDLCALC 50 10/27/2015    Physical Findings: AIMS:  , ,  ,  ,    CIWA:    COWS:     Musculoskeletal: Strength & Muscle Tone: within normal limits Gait & Station: normal Patient leans: N/A  Psychiatric Specialty Exam: Physical Exam  Nursing note and vitals reviewed.   Review of Systems  Psychiatric/Behavioral: Positive for depression, hallucinations, substance abuse and suicidal  ideas. The patient has insomnia.     Blood pressure 125/72, pulse 90, temperature 98.7 F (37.1 C), resp. rate 18, height 5\' 4"  (1.626 m),  weight 73.9 kg (163 lb), SpO2 100 %.Body mass index is 27.98 kg/m.  General Appearance: Casual  Eye Contact:  Minimal  Speech:  Slow  Volume:  Decreased  Mood:  Depressed, Hopeless and Worthless  Affect:  Blunt  Thought Process:  Disorganized and Descriptions of Associations: Tangential  Orientation:  Full (Time, Place, and Person)  Thought Content:  Delusions, Hallucinations: Auditory and Paranoid Ideation  Suicidal Thoughts:  No  Homicidal Thoughts:  No  Memory:  Immediate;   Fair Recent;   Fair Remote;   Fair  Judgement:  Poor  Insight:  Lacking  Psychomotor Activity:  Psychomotor Retardation  Concentration:  Concentration: Fair and Attention Span: Fair  Recall:  [employer] of Knowledge:  Fair  Language:  Fair  Akathisia:  No  Handed:  Right  AIMS (if indicated):     Assets:  Communication Skills Desire for Improvement Physical Health Resilience Social Support  ADL's:  Intact  Cognition:  WNL  Sleep:  Number of Hours: 8.25     Treatment Plan Summary: Daily contact with patient to assess and evaluate symptoms and progress in treatment and Medication management    Ms. Wooley is a 35 year old pregnant female with a history of depression, psychosis, suicide attempts and substance abuse admitted for hallucinations, suicidal ideation and depression.  1. Suicidal ideation. The patient is able to contract for safety in the hospital.   2. Mood and psychosis. The patient is pregnant. We started Jordan for psychosis and Zoloft for depression.    3. Substance abuse. There is a vast history of substance abuse. She would benefit from [facility] program but so far refuses.  4. Pregnancy. We started prenatal vitamins and folic acid. We'll ultrasound performed recently in the emergency room.  5. Insomnia. She slept better with Ambien 5 mg.  6. Hypothyroidism. TSH is slightly elevated at 5. Will restart Synthroid 100 mg.  7. Metabolic syndrome monitoring. Lipid  profile is normal. HgbA1C 5.7.   8. EKG.  9. Disposition. To be established.   Kristine Linea, MD 10/28/2015, 12:22 PM

## 2015-10-28 NOTE — [facility] Group Notes (Signed)
Goals Group  Date/Time: 9:00AM Type of Therapy and Topic: Group Therapy: Goals Group: SMART Goals  ?  Participation Level: Moderate  ?  Description of Group:  ?  The purpose of a daily goals group is to assist and guide patients in setting recovery/wellness-related goals. The objective is to set goals as they relate to the crisis in which they were admitted. Patients will be using SMART goal modalities to set measurable goals. Characteristics of realistic goals will be discussed and patients will be assisted in setting and processing how one will reach their goal. Facilitator will also assist patients in applying interventions and coping skills learned in psycho-education groups to the SMART goal and process how one will achieve defined goal.  ?  Therapeutic Goals:  ?  -Patients will develop and document one goal related to or their crisis in which brought them into treatment.  -Patients will be guided by LCSW using SMART goal setting modality in how to set a measurable, attainable, realistic and time sensitive goal.  -Patients will process barriers in reaching goal.  -Patients will process interventions in how to overcome and successful in reaching goal.  ?  Patient's Goal: Pt stated that her goal today is to attend all groups. In order to accomplish her goal, she stated that she will go to the groups and actively participate. ?  Therapeutic Modalities:  Motivational Interviewing  Cognitive Behavioral Therapy  Crisis Intervention Model  SMART goals setting   Hampton AbbotKadijah Fletcher Ostermiller, MSW, LCSW-A 10/28/2015, 10:39AM

## 2015-10-28 NOTE — Plan of Care (Signed)
Problem: Activity: Goal: Interest or engagement in activities will improve Outcome: Not Progressing Pt isolated to room all day. Not interacting with peers or staff.

## 2015-10-28 NOTE — Progress Notes (Signed)
Recreation Therapy Notes  Date: 09.28.17 Time: 1:00 pm Location: Craft Room  Group Topic: Leisure Education  Goal Area(s) Addresses:  Patient will identify activities for each letter of the alphabet. Patient will verbalize ability to integrate positive leisure into life post d/c. Patient will verbalize ability to use leisure as a Associate Professorcoping skill.  Behavioral Response: Attentive, Interactive  Intervention: Leisure Alphabet  Activity: Patients were given a Leisure Information systems managerAlphabet worksheet and instructed to pick healthy leisure activities for each letter of the alphabet.  Education: LRT educated patients on what they need to participate in leisure.  Education Outcome: In group clarification offered.   Clinical Observations/Feedback: Patient completed activity by writing healthy leisure activities. Patient contributed to group discussion by stating healthy leisure activities. Patient would say activities like "quaterback".  Jacquelynn CreeGreene,Lisanne Ponce M, LRT/CTRS 10/28/2015 2:33 PM

## 2015-10-28 NOTE — Progress Notes (Signed)
D:Patient presents with sad, flat affect. Denies SI, HI, AVH. Endorses depression. Pt guarded and offers minimal interaction with staff and peers. Pt hopeful reporting caseworker is assisting with finding placement for pregnant women. A: Encouragement and support offered. Medication given as prescribed. 15 min safety checks continues. R: Pt receptive, takes meds as prescribed, and remains safe on unit with q 15 min checks.

## 2015-10-29 DIAGNOSIS — F4310 Post-traumatic stress disorder, unspecified: Secondary | ICD-10-CM | POA: Diagnosis present

## 2015-10-29 DIAGNOSIS — F89 Unspecified disorder of psychological development: Secondary | ICD-10-CM | POA: Diagnosis present

## 2015-10-29 NOTE — Progress Notes (Signed)
Recreation Therapy Notes  Date: 09.29.17 Time: 9:30 am Location: Craft Room  Group Topic: Problem Solving, Communication, Teamwork  Goal Area(s) Addresses:  Patient will effectively work with peer towards shared goal. Patient will identify skills used to make activity successful. Patient will identify benefit of using group skills effectively post d/c.  Behavioral Response: Attentive, Interactive  Intervention: [employer] Cleaner Tower  Activity: Patients were split into groups and given 15 pipe cleaners. They were instructed to build a free standing tower using all 15 pipe cleaners. After about 5 minutes, patients were instructed to put their dominant hand behind their back. After another 5 minutes, patients were instructed to stop talking to each other.  Education: LRT educated patients on healthy support systems.  Education Outcome: In group clarification offered   Clinical Observations/Feedback: Patient worked with peers to build tower. Patient used effective communication, problem solving, and teamwork. Patient contributed to group discussion by stating why the skills are important.  Jacquelynn CreeGreene,Aydn Ferrara M, LRT/CTRS 10/29/2015 10:27 AM

## 2015-10-29 NOTE — [facility] Group Notes (Signed)
[facility] LCSW Group Therapy   10/29/2015 1:00 PM   Type of Therapy: Group Therapy   Participation Level: Active   Participation Quality: Attentive, Sharing and Supportive   Affect: Appropriate   Cognitive: Alert and Oriented   Insight: Developing/Improving and Engaged   Engagement in Therapy: Developing/Improving and Engaged   Modes of Intervention: Clarification, Confrontation, Discussion, Education, Exploration, Limit-setting, Orientation, Problem-solving, Rapport Building, Dance movement psychotherapisteality Testing, Socialization and Support   Summary of Progress/Problems: The topic for today was feelings about relapse. Pt discussed what relapse prevention is to them and identified triggers that they are on the path to relapse. Pt processed their feeling towards relapse and was able to relate to peers. Pt discussed coping skills that can be used for relapse prevention.  Pt defined relapse as "a breakdown." She stated that her intrusive symptoms are confusion, PTSD symptoms, and worrying. Pt identified volunteering and connecting with community resources as ways to prevent relapse.    Hampton AbbotKadijah Ineta Sinning, MSW, LCSWA 10/29/2015, 2:42PM

## 2015-10-29 NOTE — Progress Notes (Signed)
D: Pt denies SI/HI/AVH, patient is irritable and unwilling .to participate in treatment plan with staff. Pt isolated into her room the entire shift with minimal interaction with staff and none with peers on the unit. A: Pt was offered support and encouragement. Pt was given scheduled medications. Pt was encouraged to attend groups. Q 15 minute checks were done for safety.  R:Pt did not attend evening groups. Pt has no complaints.Pt is not receptive to treatment. Safety maintained on unit, will continue to monitor.
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
## 2015-10-29 NOTE — Plan of Care (Signed)
Problem: Coping: Goal: Ability to verbalize frustrations and anger appropriately will improve Outcome: Not Progressing Patient is guarded, shared no  information with staff.

## 2015-10-29 NOTE — [facility] Group Notes (Signed)
[facility] LCSW Group Therapy   10/28/2015 9:30 am *Late Entry  Type of Therapy: Group Therapy   Participation Level: Active   Participation Quality: Attentive, Sharing and Supportive   Affect: Appropriate   Cognitive: Alert and Oriented   Insight: Developing/Improving and Engaged   Engagement in Therapy: Developing/Improving and Engaged   Modes of Intervention: Clarification, Confrontation, Discussion, Education, Exploration, Limit-setting, Orientation, Problem-solving, Rapport Building, Dance movement psychotherapisteality Testing, Socialization and Support   Summary of Progress/Problems: The topic for group was balance in life. Today's group focused on defining balance in one's own words, identifying things that can knock one off balance, and exploring healthy ways to maintain balance in life. Group members were asked to provide an example of a time when they felt off balance, describe how they handled that situation, and process healthier ways to regain balance in the future. Group members were asked to share the most important tool for maintaining balance that they [facility] and how they plan to apply this method after discharge. Pt shared with the group that in order to maintain balance in the pt's life the pt tries to "take charge of situations". Pt shared that "not stepping up" can "knock the pt off balance"  Pt shared that when the pt failed to do for herself hat she should, this resulted in the pt "feeling off balance" and that being focused on the pt's tasks and completing them was what the pt found to be successful for the pt.  Pt was polite and cooperative with the CSW and other group members and focused and attentive to the topics discussed and the sharing of others.  10/28/15 *Late Entry

## 2015-10-30 NOTE — Plan of Care (Signed)
Problem: Coping: Goal: Ability to verbalize feelings will improve Outcome: Not Progressing Reluctant to discuss why she is here, will answer yes and no questions by nodding.

## 2015-10-30 NOTE — [facility] Group Notes (Signed)
[facility] Group Notes:  (Nursing/MHT/Case Management/Adjunct)  Date:  10/30/2015  Time:  10:07 PM  Type of Therapy:  Psychoeducational Skills  Participation Level:  Did Not Attend  Participation Quality:  Summary of Progress/Problems:  Mayra NeerJackie L Channin Agustin 10/30/2015, 10:07 PM

## 2015-10-30 NOTE — Progress Notes (Addendum)
D: Pt denies SI/HI/AVH, but noted responding to internal stimuli. Pt, affect is flat and sad, appears less anxious,Patient isolates to the room minimal interaction with staff.  A: Pt was offered support and encouragement. Pt was given scheduled medications. Pt was encouraged to attend groups. Q 15 minute checks were done for safety.  R:Pt did not attend evening group.  Pt is taking medication. Pt has no complaints.Pt receptive to treatment and safety maintained on unit.

## 2015-10-30 NOTE — Progress Notes (Signed)
Patient affect is brighter.  Visible in milieu interacting with with peers, laughing and joking. Verbalizes that she feels better since being back on her medication.  Attending groups.  Denies SI/HI/AVH.  Support and encouragement offered.  Safety maintained.

## 2015-10-30 NOTE — [facility] Group Notes (Signed)
[facility] Group Notes:  (Nursing/MHT/Case Management/Adjunct)  Date:  10/30/2015  Time:  1:04 AM  Type of Therapy:  Psychoeducational Skills  Participation Level:  Did Not Attend   Summary of Progress/Problems:  Mary MilroyLaquanda Y Paydon Ramirez 10/30/2015, 1:04 AM
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
## 2015-10-30 NOTE — [facility] Group Notes (Signed)
[facility] LCSW Group Therapy  10/30/2015 2:26 PM  Type of Therapy:  Group Therapy  Participation Level:  Active  Participation Quality:  Attentive  Affect:  Appropriate  Cognitive:  Alert  Insight:  Improving  Engagement in Therapy:  Improving  Modes of Intervention:  Activity, Discussion, Education and Support  Summary of Progress/Problems:Balance in life: Patients will discuss the concept of balance and how it looks and feels to be unbalanced. Pt will identify areas in their life that is unbalanced and ways to become more balanced. Patient stated she wants to improve her support system and gain resources due to her homelessness.   Jermarion Poffenberger G. Garnette CzechSampson MSW, LCSWA 10/30/2015, 2:27 PM

## 2015-10-31 MED ORDER — LORATADINE 10 MG PO TABS
10.0000 mg | ORAL_TABLET | Freq: Every day | ORAL | Status: DC
  Administered 2015-10-31 – 2015-11-03 (×4): 10 mg via ORAL
  Filled 2015-10-31 (×4): qty 1

## 2015-10-31 MED ORDER — HYDROXYZINE HCL 25 MG PO TABS
25.0000 mg | ORAL_TABLET | ORAL | Status: DC | PRN
  Administered 2015-11-02 – 2015-11-03 (×2): 25 mg via ORAL
  Filled 2015-10-31: qty 1

## 2015-10-31 NOTE — [facility] Group Notes (Signed)
[facility] Group Notes:  (Nursing/MHT/Case Management/Adjunct)  Date:  10/31/2015  Time:  9:25 PM  Type of Therapy:  Evening Wrap-up Group  Participation Level:  Did Not Attend  Participation Quality:  N/A  Affect:  N/A  Cognitive:  N/A  Insight:  None  Engagement in Group:  Did Not Attend  Modes of Intervention:  Discussion  Summary of Progress/Problems:  Tomasita MorrowChelsea Nanta Janett Kamath 10/31/2015, 9:25 PM
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
## 2015-10-31 NOTE — Progress Notes (Signed)
D: Observed pt in her room lying in bed. Patient alert and oriented x4. Patient denies SI/HI/AVH. Pt affect is blunted. Pt came out of room briefly, but isolated to room most of night. Pt stated she was "sleepy" when writer assessed pt, so pt provided minimal answers. Pt rated depression 5/10 and anxiety 0/10. Pt endorsed her mood improving "because I take my medicine." Pt had no complaints nor questions for righter. A: Offered active listening and support. Provided therapeutic communication. Encouraged pt to attend group and actively participate in care. R: Pt pleasant and cooperative. Pt had no medications to give this evening. Will continue Q15 min. checks. Safety maintained.

## 2015-10-31 NOTE — Progress Notes (Signed)
Patient ID: Mary Ramirez, female   DOB: Jul 15, 1980, 35 y.o.   MRN: [id number]  CSW met with patient to complete application for [facility] in [place]. This is a residential program for pregnant women and mothers with addiction issues. Residents in the program can stay up to 2 years. Patient was difficult to engage and was resistant to answering questions saying "This don't even matter if yall are just going to discharge me to the street". CSW offered support and encouragement to patient. Application was completed except for the written portion of the application that must be completed by patient independently. Patient stated "Why do I have to do this?" CSW explained that the written portion is a requirement by the program. CSW provided the written portion and a printout with details of the program the help answer any questions the patient may have. Weekday CSW will follow-up with patient to complete application for referral.   Shaneca Orne G. Claybon Jabs MSW, [place] 10/31/2015 4:21 PM

## 2015-10-31 NOTE — Progress Notes (Signed)
D:Alert and oriented.  Affect blunted although brightens upon approach.  Verbalizes that there is nothing to do here.  Denies SI/HI/AVH.   A:  Scheduled medications given.  Support and  Encouragement offered.   R: Safety maintained.  Medication and group compliant.

## 2015-10-31 NOTE — [facility] Group Notes (Signed)
[facility] LCSW Group Therapy  10/31/2015 4:45 PM  Type of Therapy:  Group Therapy  Participation Level:  Minimal  Participation Quality:  Attentive  Affect:  Irritable  Cognitive:  Alert  Insight:  Limited  Engagement in Therapy:  Limited  Modes of Intervention:  Activity, Discussion, Education and Support  Summary of Progress/Problems:Safety Planning: Patients identified fears or worries surrounding discharge. Patients offered support to their peers and openly developed safety plans for their individual needs. Patients developed their own safety plan. Patients discussed their warning signs, coping strategies, support system with family and friends, identified mental health professionals, and how to keep their environments safe (ex. Removing unnecessary medications or removing weapons/guns). Patients then discussed their personalized safety plan with the group.    Mary Ramirez G. Garnette CzechSampson MSW, LCSWA 10/31/2015, 4:46 PM

## 2015-10-31 NOTE — Plan of Care (Addendum)
Problem: Activity: Goal: Interest or engagement in activities will improve Outcome: Not Progressing Pt came out of room briefly, but stayed in her room most of the evening.

## 2015-10-31 NOTE — Plan of Care (Signed)
Problem: Activity: Goal: Interest or engagement in leisure activities will improve Outcome: Progressing More visible in milieu.  Engages with peers.

## 2015-11-01 MED ORDER — ZOLPIDEM TARTRATE 5 MG PO TABS: 5.0000 mg | ORAL_TABLET | Freq: Every evening | ORAL | 0 refills | Status: DC | PRN

## 2015-11-01 MED ORDER — LURASIDONE HCL 80 MG PO TABS: 80.0000 mg | ORAL_TABLET | Freq: Every day | ORAL | 1 refills | Status: DC

## 2015-11-01 MED ORDER — PRENATAL PLUS 27-1 MG PO TABS: 1.0000 | ORAL_TABLET | Freq: Every day | ORAL | 1 refills | Status: DC

## 2015-11-01 MED ORDER — FOLIC ACID 1 MG PO TABS: 2.0000 mg | ORAL_TABLET | Freq: Every day | ORAL | 1 refills | Status: DC

## 2015-11-01 MED ORDER — LEVOTHYROXINE SODIUM 100 MCG PO TABS: 100.0000 ug | ORAL_TABLET | Freq: Every day | ORAL | 1 refills | Status: DC

## 2015-11-01 MED ORDER — SERTRALINE HCL 50 MG PO TABS: 50.0000 mg | ORAL_TABLET | Freq: Every day | ORAL | 1 refills | Status: DC

## 2015-11-01 MED ORDER — LORATADINE 10 MG PO TABS: 10.0000 mg | ORAL_TABLET | Freq: Every day | ORAL | 1 refills | Status: DC

## 2015-11-01 NOTE — Progress Notes (Signed)
Recreation Therapy Notes  Date: 10.02.17 Time: 9:30 am Location: Craft Room  Group Topic: Self-expression  Goal Area(s) Addresses:  Patient will draw a bottle of how they see themselves. Patient will write at least one emotion they are experiencing.  Behavioral Response: Intermittently Attentive  Intervention: Bottled Up  Activity: Patients were instructed to draw a bottle of how they see themselves. Patients were instructed to write the emotions they were feeling on the inside of the bottle.  Education: LRT educated group on other forms of self-expression.  Education Outcome: In group clarification offered  Clinical Observations/Feedback: Patient completed activity by drawing a bottle the way she sees herself and writing how she feels inside the bottle. Patient did not contribute to group discussion. Patient laid her head on the table.  Jacquelynn CreeGreene,Melda Mermelstein M, LRT/CTRS 11/01/2015 10:20 AM

## 2015-11-01 NOTE — Progress Notes (Signed)
D: Observed pt in bed awake. Patient alert and oriented x4. Patient denies SI/HI/AVH. Pt affect is sad. Pt isolated to room all evening. Pt does not initiate interaction, and forwards very little. Pt stated "I just feel tired." Pt denied feeling depressed or anxious. Pt also stated her time here "has been helpful." Pt had no complaints. A: Offered active listening and support. Provided therapeutic communication. Encouraged pt to attend evening group and actively participate in care. R: Pt pleasant and cooperative. Pt did not attend evening group. Will continue Q15 min. checks. Safety maintained.
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
## 2015-11-01 NOTE — Plan of Care (Signed)
Problem: Activity: Goal: Sleeping patterns will improve Outcome: Progressing Pt has been averaging over 7 hours most nights.

## 2015-11-01 NOTE — Social Work (Signed)
CSW and patient [facility] and faxed completed application to [phone]. CSW contacted [facility] administrator who stated that she has received the application and will screen the applicant to make a decision whether patient meets the facility requirements.   Hampton AbbotKadijah Quanita Barona, MSW, LCSW-A 11/01/2015, 11:20AM
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
## 2015-11-02 NOTE — Progress Notes (Signed)
[facility] MD Progress Note  11/02/2015 12:11 PM Mary Ramirez  MRN:  [id number]  Subjective:  Mary Ramirez is a 35 year old pregnant homeless female from [facility] was admitted for psychotic break. She has been homeless and untreated for the past 2 years. She was started on Latuda for psychosis and Zoloft for depression. She takes Ambien for sleep. She is on prenatal vitamins and folic acid. We'll try sound in [facility] emergency room confirmed life 11 week fetus. She came to the hospital psychotic, hallucinating, disorganized, almost catatonic. She has much improved and is now able to participate in the interview and programming. She has absolutely no resources.  Mary Ramirez met with treatment team today. We are still awaiting response from [facility] house. The patient is pregnant, uninsured, has no support in the community at all. In addition, there is developmental disability. She does not read or write. She will need assistance applying for pregnant Medicaid that could be provided by [facility] house staff. The patient demands discharge. She feels trapped in the hospital but will wait a day for their response.  The patient denies any symptoms of depression, anxiety or psychosis. Sleep and appetite are good. She tolerates medications well. There are no somatic complaints. Good program participation.   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 PTSD (post-traumatic stress disorder) [F43.10] 10/29/2015  . 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 Cannabis use disorder, moderate, dependence ([place]) [F12.20] 10/26/2015  . Suicidal ideation [R45.851] 10/26/2015  . Pregnant [Z34.90] 10/26/2015  . Tobacco use disorder [F17.200] 10/26/2015  . Undifferentiated schizophrenia ([place]) [F20.3] 10/26/2015  . Hypothyroidism [E03.9] 10/26/2015  . 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  Past Medical History:  Past Medical History:  Diagnosis Date  . Depression    History reviewed. No pertinent surgical history. Family History: History reviewed. No pertinent family history. Family Psychiatric  History: see H&P. Social History:  History  Alcohol Use  . Yes     History  Drug Use  . Types: Marijuana, Cocaine, "Crack" cocaine, MDMA (Ecstacy)    Social History   Social History  . Marital status: Single    Spouse name: N/A  . Number of children: N/A  . Years of education: N/A   Social History Main Topics  . Smoking status: Current Every Day Smoker    Years: 1.00  . Smokeless tobacco: Never Used  . Alcohol use Yes  . Drug use:     Types: Marijuana, Cocaine, "Crack" cocaine, MDMA (Ecstacy)  . Sexual activity: Not Asked   Other Topics Concern  . None   Social History Narrative  . None   Additional Social History:                         Sleep: Fair  Appetite:  Fair  Current Medications: Current Facility-Administered Medications  Medication Dose Route Frequency Provider Last Rate Last Dose  . acetaminophen (TYLENOL) tablet 650 mg  650 mg Oral Q6H PRN Kincade Granberg B Mirka Barbone, MD      . alum & mag hydroxide-simeth (MAALOX/MYLANTA) 200-200-20 MG/5ML suspension 30 mL  30 mL Oral Q4H PRN Toryn Dewalt B Caliegh Middlekauff, MD      . folic acid (FOLVITE) tablet 2 mg  2 mg Oral Daily Deniz Eskridge B Shere Eisenhart, MD   2 mg at 11/02/15  1150  . hydrOXYzine (ATARAX/VISTARIL) tablet 25 mg  25 mg Oral Q4H PRN Gonzella Lex, MD   25 mg at 11/02/15 1148  . levothyroxine (SYNTHROID, LEVOTHROID) tablet 100 mcg  100 mcg Oral QAC breakfast Clovis Fredrickson, MD   100 mcg at 11/02/15 0617  . loratadine (CLARITIN) tablet 10 mg  10 mg Oral Daily Gonzella Lex, MD   10 mg at 11/02/15 1150  . lurasidone (LATUDA) tablet 80 mg  80 mg Oral Q supper Ula Couvillon B Sherilyn Windhorst, MD    80 mg at 11/01/15 1700  . magnesium hydroxide (MILK OF MAGNESIA) suspension 30 mL  30 mL Oral Daily PRN Anyelin Mogle B Azlynn Mitnick, MD      . prenatal vitamin w/FE, FA (PRENATAL 1 + 1) 27-1 MG tablet 1 tablet  1 tablet Oral Q1200 Timouthy Gilardi B Vicci Reder, MD   1 tablet at 11/02/15 1149  . sertraline (ZOLOFT) tablet 50 mg  50 mg Oral Daily Jamerson Vonbargen B Tavaria Mackins, MD   50 mg at 11/02/15 1149  . zolpidem (AMBIEN) tablet 5 mg  5 mg Oral QHS PRN Mazal Ebey B Cleburn Maiolo, MD        Lab Results:  No results found for this or any previous visit (from the past 48 hour(s)).  Blood Alcohol level:  Lab Results  Component Value Date   ETH <5 66/07/3014    Metabolic Disorder Labs: Lab Results  Component Value Date   HGBA1C 5.7 (H) 10/27/2015   MPG 117 10/27/2015   No results found for: PROLACTIN Lab Results  Component Value Date   CHOL 107 10/27/2015   TRIG 88 10/27/2015   HDL 39 (L) 10/27/2015   CHOLHDL 2.7 10/27/2015   VLDL 18 10/27/2015   LDLCALC 50 10/27/2015    Physical Findings: AIMS:  , ,  ,  ,    CIWA:    COWS:     Musculoskeletal: Strength & Muscle Tone: within normal limits Gait & Station: normal Patient leans: N/A  Psychiatric Specialty Exam: Physical Exam  Nursing note and vitals reviewed. Constitutional: She appears well-developed and well-nourished.  HENT:  Head: Normocephalic and atraumatic.  Eyes: Conjunctivae are normal. Pupils are equal, round, and reactive to light.  Neck: Normal range of motion.  Cardiovascular: Normal heart sounds.   Respiratory: Effort normal.  GI: Soft.  Musculoskeletal: Normal range of motion.  Neurological: She is alert.  Skin: Skin is warm and dry.  Psychiatric: Her affect is blunt. She is slowed.    Review of Systems  Psychiatric/Behavioral: Positive for depression, hallucinations, substance abuse and suicidal ideas. The patient has insomnia.     Blood pressure 101/61, pulse 62, temperature 98.6 F (37 C), temperature source Oral, resp.  rate 20, height _0  (1.626 m), weight 75.3 kg (166 lb), SpO2 100 %.Body mass index is 28.49 kg/m.  General Appearance: Casual  Eye Contact:  Minimal  Speech:  Slow  Volume:  Decreased  Mood:  Depressed, Hopeless and Worthless  Affect:  Blunt  Thought Process:  Disorganized and Descriptions of Associations: Tangential  Orientation:  Full (Time, Place, and Person)  Thought Content:  Delusions, Hallucinations: Auditory and Paranoid Ideation  Suicidal Thoughts:  No  Homicidal Thoughts:  No  Memory:  Immediate;   Fair Recent;   Fair Remote;   Fair  Judgement:  Poor  Insight:  Lacking  Psychomotor Activity:  Psychomotor Retardation  Concentration:  Concentration: Fair and Attention Span: Fair  Recall:  [employer] of Knowledge:  Fair  Language:  Fair  Akathisia:  No  Handed:  Right  AIMS (if indicated):     Assets:  Communication Skills Desire for Improvement Physical Health Resilience Social Support  ADL's:  Intact  Cognition:  WNL  Sleep:  Number of Hours: 8.75     Treatment Plan Summary: Daily contact with patient to assess and evaluate symptoms and progress in treatment and Medication management    Ms. Smylie is a 35 year old pregnant female with a history of depression, psychosis, suicide attempts and substance abuse admitted for hallucinations, suicidal ideation and depression.  1. Suicidal ideation. Resolved. The patient is able to contract for safety.    2. Mood, PTSD and psychosis. The patient is pregnant. We started [place] for psychosis and Zoloft for depression.    3. Substance abuse. There is long history of substance abuse. She would benefit from [facility].  4. Pregnancy. We started prenatal vitamins and folic acid. Ultrasound performed recently in the emergency room.  5. Insomnia. She slept better with Ambien 5 mg.  6. Hypothyroidism. TSH is slightly elevated at 5. We restarted Synthroid 811 mg.  7. Metabolic syndrome monitoring.  Lipid profile is normal. HgbA1C 5.7.   8. Disposition. To be established.    Orson Slick, MD 11/02/2015, 12:11 PM

## 2015-11-02 NOTE — [facility] Group Notes (Signed)
[facility] Group Notes:  (Nursing/MHT/Case Management/Adjunct)  Date:  11/02/2015  Time:  9:09 PM  Type of Therapy:  Psychoeducational Skills  Participation Level:  Did Not Attend   Summary of Progress/Problems:  Mary MilroyLaquanda Y Cristino Ramirez 11/02/2015, 9:09 PM

## 2015-11-02 NOTE — Plan of Care (Signed)
Problem: Safety: Goal: Ability to remain free from injury will improve Outcome: Progressing Patient remained without injury during this shift.

## 2015-11-02 NOTE — [facility] Group Notes (Signed)
[facility] Group Notes:  (Nursing/MHT/Case Management/Adjunct)  Date:  11/02/2015  Time:  4:17 AM  Type of Therapy:  Group Therapy  Participation Level:  Did Not Attend    Veva Holesshley Imani Shepard Keltz 11/02/2015, 4:17 AM

## 2015-11-02 NOTE — Progress Notes (Signed)
D: Patient isolated to room. States she feels the same as when she got her but does appear less angry. Denies SI/HI/AVH. Did not get up for snack or group.  A: No HS medication given. Encouragement provided,  R: Patient remained calm and cooperative. Safety maintained with 15 min checks.

## 2015-11-02 NOTE — Progress Notes (Signed)
Recreation Therapy Notes  Date: 10.03.17 Time: 9:30 am Location: Craft Room  Group Topic: Goal Setting  Goal Area(s) Addresses:  Patient will write at least one goal. Patient will write at least one obstacle.  Behavioral Response: Did not attend  Intervention: Recovery Goal Chart  Activity: Patients were instructed to make a Recovery Goal Chart including their goals, obstacles, the date they started working on their goals, and the date they achieved their goals.  Education: LRT educated patients on healthy ways to celebrate reaching their goals.  Education Outcome: Patient did not attend group.  Clinical Observations/Feedback: Patient did not attend group.  Jacquelynn CreeGreene,Daizee Firmin M, LRT/CTRS  11/02/2015 11:46 AM

## 2015-11-02 NOTE — [facility] Group Notes (Signed)
[facility] Group Notes:  (Nursing/MHT/Case Management/Adjunct)  Date:  11/02/2015  Time:  3:51 PM  Type of Therapy:  Psychoeducational Skills  Participation Level:  Minimal  Participation Quality:  Inattentive  Affect:  Depressed, Flat, Irritable, Labile and Resistant  Cognitive:  Appropriate  Insight:  Limited  Engagement in Group:  Lacking  Modes of Intervention:  Discussion and Education  Summary of Progress/Problems:  Mickey Farberamela M Moses Ellison 11/02/2015, 3:51 PM

## 2015-11-02 NOTE — Progress Notes (Signed)
Denies SI/HI/AVH.  Affect blunted. Support and encouragement offered. Safety maintained.

## 2015-11-02 NOTE — [facility] Group Notes (Signed)
[facility] LCSW Group Therapy   11/02/2015 1pm  Type of Therapy: Group Therapy   Participation Level: Active   Participation Quality: Attentive, Sharing and Supportive   Affect: Appropriate  Cognitive: Alert and Oriented   Insight: Developing/Improving and Engaged   Engagement in Therapy: Developing/Improving and Engaged   Modes of Intervention: Clarification, Confrontation, Discussion, Education, Exploration,  Limit-setting, Orientation, Problem-solving, Rapport Building, Dance movement psychotherapisteality Testing, Socialization and Support  Summary of Progress/Problems: The topic for group therapy was feelings about diagnosis. Pt actively participated in group discussion on their past and current diagnosis and how they feel towards this. Pt also identified how society and family members judge them, based on their diagnosis as well as stereotypes and stigmas. Pt shared the pt has been diagnosed with post-partem depression.  Pt presents as being depressed, but was calm.  Pt shared the pt has finally accepted the pt's current diagnosis and that this has helped the pt.  Pt shared the pt feels the pt's diagnosis is correct, but that the pt feels that "other people don't help".  Pt would at times present as guarded and withdrawn, but pt was polite and cooperative with the CSW and other group members and focused and attentive to the topics discussed.  Pt reports pt at times has felt judged by others.  Pt seems to have made great improvement during group, as evidenced by increased sharing, sharing at length when prompted and pt presents as less guarded than in previous sessions and increasingly energetic, as compared to previous sessions.      Dorothe PeaJonathan F. Brandilyn Nanninga, LCSWA, LCAS

## 2015-11-02 NOTE — [facility] Group Notes (Signed)
Goals Group Date/Time: 11/02/2015 9:00 AM Type of Therapy and Topic: Group Therapy: Goals Group: SMART Goals   Participation Level: Moderate  Description of Group:    The purpose of a daily goals group is to assist and guide patients in setting recovery/wellness-related goals. The objective is to set goals as they relate to the crisis in which they were admitted. Patients will be using SMART goal modalities to set measurable goals. Characteristics of realistic goals will be discussed and patients will be assisted in setting and processing how one will reach their goal. Facilitator will also assist patients in applying interventions and coping skills learned in psycho-education groups to the SMART goal and process how one will achieve defined goal.   Therapeutic Goals:   -Patients will develop and document one goal related to or their crisis in which brought them into treatment.  -Patients will be guided by LCSW using SMART goal setting modality in how to set a measurable, attainable, realistic and time sensitive goal.  -Patients will process barriers in reaching goal.  -Patients will process interventions in how to overcome and successful in reaching goal.   Patient's Goal:  Pt shared that the pt's goal is to get a job.  Pt shared that the pt intends to make this time-sensitive as evidenced by the pt finding a job within one month of discharge.  Pt intends to complete the pt's goal with the help of a Child psychotherapistsocial worker after she is is discharged.   Therapeutic Modalities:  Motivational Interviewing  Research officer, political partyCognitive Behavioral Therapy  Crisis Intervention Model  SMART goals setting   Dorothe PeaJonathan F. Issaiah Seabrooks, LCSWA, LCAS

## 2015-11-02 NOTE — Tx Team (Signed)
Mary Ramirez  Pt name [id number]    Undifferentiated schizophrenia [facility](HCC)  Principal Problem Principal Problem:   Undifferentiated schizophrenia (HCC) Active Problems:   Severe recurrent major depressive disorder with psychotic features (HCC)   Cannabis use disorder, moderate, dependence (HCC)   Suicidal ideation   Pregnant   Tobacco use disorder   Hypothyroidism   PTSD (post-traumatic stress disorder)   Developmental disability  Secondary Dx/Hospital Problem List Current Facility-Administered Medications  Medication Dose Route Frequency Provider Last Rate Last Dose  . acetaminophen (TYLENOL) tablet 650 mg  650 mg Oral Q6H PRN Jolanta B Pucilowska, MD      . alum & mag hydroxide-simeth (MAALOX/MYLANTA) 200-200-20 MG/5ML suspension 30 mL  30 mL Oral Q4H PRN Jolanta B Pucilowska, MD      . folic acid (FOLVITE) tablet 2 mg  2 mg Oral Daily Jolanta B Pucilowska, MD   2 mg at 11/01/15 0859  . hydrOXYzine (ATARAX/VISTARIL) tablet 25 mg  25 mg Oral Q4H PRN Audery AmelJohn T Clapacs, MD      . levothyroxine (SYNTHROID, LEVOTHROID) tablet 100 mcg  100 mcg Oral QAC breakfast Shari ProwsJolanta B Pucilowska, MD   100 mcg at 11/02/15 0617  . loratadine (CLARITIN) tablet 10 mg  10 mg Oral Daily Audery AmelJohn T Clapacs, MD   10 mg at 11/01/15 0859  . lurasidone (LATUDA) tablet 80 mg  80 mg Oral Q supper Jolanta B Pucilowska, MD   80 mg at 11/01/15 1700  . magnesium hydroxide (MILK OF MAGNESIA) suspension 30 mL  30 mL Oral Daily PRN Jolanta B Pucilowska, MD      . prenatal vitamin w/FE, FA (PRENATAL 1 + 1) 27-1 MG tablet 1 tablet  1 tablet Oral Q1200 Jolanta B Pucilowska, MD   1 tablet at 11/01/15 1200  . sertraline (ZOLOFT) tablet 50 mg  50 mg Oral Daily Jolanta B Pucilowska, MD   50 mg at 11/01/15 0859  . zolpidem (AMBIEN) tablet 5 mg  5 mg Oral QHS PRN Shari ProwsJolanta B Pucilowska, MD         Current Meds Prescriptions Prior to Admission  Medication Sig Dispense Refill Last Dose  . PRESCRIPTION MEDICATION Patient states that she has  not taken any medication in seven months or more. States that she was previously prescribed Zoloft 100 mg daily, Prozac 20 daily, Synthroid 100 mcg daily, metformin 500 twice daily, and trazodone 150 mg at bedtime.   more than 7 months ago     Prior to Admission Meds   Interdisciplinary Treatment and Diagnostic Plan Update  11/02/2015 Time of Session: 10:53 AM  Mary Hewsdonya Konieczny MRN: [id number]  Principal Diagnosis: Undifferentiated schizophrenia (HCC)  Secondary Diagnoses: Principal Problem:   Undifferentiated schizophrenia (HCC) Active Problems:   Severe recurrent major depressive disorder with psychotic features (HCC)   Cannabis use disorder, moderate, dependence (HCC)   Suicidal ideation   Pregnant   Tobacco use disorder   Hypothyroidism   PTSD (post-traumatic stress disorder)   Developmental disability   Current Medications:  Current Facility-Administered Medications  Medication Dose Route Frequency Provider Last Rate Last Dose  . acetaminophen (TYLENOL) tablet 650 mg  650 mg Oral Q6H PRN Jolanta B Pucilowska, MD      . alum & mag hydroxide-simeth (MAALOX/MYLANTA) 200-200-20 MG/5ML suspension 30 mL  30 mL Oral Q4H PRN Jolanta B Pucilowska, MD      . folic acid (FOLVITE) tablet 2 mg  2 mg Oral Daily Shari ProwsJolanta B Pucilowska, MD   2 mg at 11/01/15 0859  .  hydrOXYzine (ATARAX/VISTARIL) tablet 25 mg  25 mg Oral Q4H PRN Audery Amel, MD      . levothyroxine (SYNTHROID, LEVOTHROID) tablet 100 mcg  100 mcg Oral QAC breakfast Shari Prows, MD   100 mcg at 11/02/15 0617  . loratadine (CLARITIN) tablet 10 mg  10 mg Oral Daily Audery Amel, MD   10 mg at 11/01/15 0859  . lurasidone (LATUDA) tablet 80 mg  80 mg Oral Q supper Jolanta B Pucilowska, MD   80 mg at 11/01/15 1700  . magnesium hydroxide (MILK OF MAGNESIA) suspension 30 mL  30 mL Oral Daily PRN Jolanta B Pucilowska, MD      . prenatal vitamin w/FE, FA (PRENATAL 1 + 1) 27-1 MG tablet 1 tablet  1 tablet Oral Q1200 Jolanta B  Pucilowska, MD   1 tablet at 11/01/15 1200  . sertraline (ZOLOFT) tablet 50 mg  50 mg Oral Daily Jolanta B Pucilowska, MD   50 mg at 11/01/15 0859  . zolpidem (AMBIEN) tablet 5 mg  5 mg Oral QHS PRN Shari Prows, MD        PTA Medications: Prescriptions Prior to Admission  Medication Sig Dispense Refill Last Dose  . PRESCRIPTION MEDICATION Patient states that she has not taken any medication in seven months or more. States that she was previously prescribed Zoloft 100 mg daily, Prozac 20 daily, Synthroid 100 mcg daily, metformin 500 twice daily, and trazodone 150 mg at bedtime.   more than 7 months ago    Treatment Modalities: Medication Management, Group therapy, Case management,  1 to 1 session with clinician, Psychoeducation, Recreational therapy.   Physician Treatment Plan for Primary Diagnosis: Undifferentiated schizophrenia (HCC) Long Term Goal(s): Improvement in symptoms so as ready for discharge  Short Term Goals: Ability to identify changes in lifestyle to reduce recurrence of condition will improve, Ability to verbalize feelings will improve, Ability to disclose and discuss suicidal ideas, Ability to demonstrate self-control will improve, Ability to identify and develop effective coping behaviors will improve, Ability to maintain clinical measurements within normal limits will improve and Compliance with prescribed medications will improve  Medication Management: Evaluate patient's response, side effects, and tolerance of medication regimen.  Therapeutic Interventions: 1 to 1 sessions, Unit Group sessions and Medication administration.  Evaluation of Outcomes: Progressing  Physician Treatment Plan for Secondary Diagnosis: Principal Problem:   Undifferentiated schizophrenia (HCC) Active Problems:   Severe recurrent major depressive disorder with psychotic features (HCC)   Cannabis use disorder, moderate, dependence (HCC)   Suicidal ideation   Pregnant   Tobacco use  disorder   Hypothyroidism   PTSD (post-traumatic stress disorder)   Developmental disability   Long Term Goal(s): Improvement in symptoms so as ready for discharge  Short Term Goals: Ability to identify changes in lifestyle to reduce recurrence of condition will improve, Ability to verbalize feelings will improve, Ability to disclose and discuss suicidal ideas, Ability to demonstrate self-control will improve, Ability to identify and develop effective coping behaviors will improve, Ability to maintain clinical measurements within normal limits will improve and Compliance with prescribed medications will improve  Medication Management: Evaluate patient's response, side effects, and tolerance of medication regimen.  Therapeutic Interventions: 1 to 1 sessions, Unit Group sessions and Medication administration.  Evaluation of Outcomes: Progressing   RN Treatment Plan for Primary Diagnosis: Undifferentiated schizophrenia (HCC) Long Term Goal(s): Knowledge of disease and therapeutic regimen to maintain health will improve  Short Term Goals: Ability to remain free from injury will  improve, Ability to verbalize frustration and anger appropriately will improve, Ability to disclose and discuss suicidal ideas, Ability to identify and develop effective coping behaviors will improve and Compliance with prescribed medications will improve  Medication Management: RN will administer medications as ordered by provider, will assess and evaluate patient's response and provide education to patient for prescribed medication. RN will report any adverse and/or side effects to prescribing provider.  Therapeutic Interventions: 1 on 1 counseling sessions, Psychoeducation, Medication administration, Evaluate responses to treatment, Monitor vital signs and CBGs as ordered, Perform/monitor CIWA, COWS, AIMS and Fall Risk screenings as ordered, Perform wound care treatments as ordered.  Evaluation of Outcomes:  Progressing   LCSW Treatment Plan for Primary Diagnosis: Undifferentiated schizophrenia (HCC) Long Term Goal(s): Safe transition to appropriate next level of care at discharge, Engage patient in therapeutic group addressing interpersonal concerns.  Short Term Goals: Engage patient in aftercare planning with referrals and resources, Increase social support, Identify triggers associated with mental health/substance abuse issues and Increase skills for wellness and recovery  Therapeutic Interventions: Assess for all discharge needs, 1 to 1 time with Social worker, Explore available resources and support systems, Assess for adequacy in community support network, Educate family and significant other(s) on suicide prevention, Complete Psychosocial Assessment, Interpersonal group therapy.  Evaluation of Outcomes: Progressing   Progress in Treatment: Attending groups: Yes Participating in groups: Yes Taking medication as prescribed: Yes, MD continues to assess for medication changes as needed Toleration medication: Yes, no side effects reported at this time Family/Significant other contact made: Yes, CSW contacted friend, Jamelle Haring Patient understands diagnosis:  Discussing patient identified problems/goals with staff: Yes Medical problems stabilized or resolved: Yes Denies suicidal/homicidal ideation:  Issues/concerns per patient self-inventory: None Other: N/A  New problem(s) identified: None identified at this time.   New Short Term/Long Term Goal(s): None identified at this time.   Discharge Plan or Barriers:   Reason for Continuation of Hospitalization: Anxiety Delusions  Depression Hallucinations Homicidal ideation [place] Medical Issues Medication stabilization Suicidal ideation Withdrawal symptoms  Estimated Length of Stay: 3-5 days  Attendees: Patient: Mary Ramirez  11/02/2015  10:53 AM  Physician: Dr. Kristine Linea, MD 11/02/2015  10:53 AM  Nursing: Leonia Reader, RN  11/02/2015  10:53 AM  RN Care Manager:  11/02/2015  10:53 AM  Social Worker: Hampton Abbot, MSW, LCSW-A 11/02/2015  10:53 AM  Recreational Therapist: Hershal Coria, LRT 11/02/2015  10:53 AM  Other:  11/02/2015  10:53 AM  Other:  11/02/2015  10:53 AM  Other: 11/02/2015  10:53 AM    Scribe for Treatment Team: Lynden Oxford, MSW, LCSW-A

## 2015-11-03 MED ORDER — HYDROXYZINE HCL 50 MG PO TABS
ORAL_TABLET | ORAL | Status: AC
  Filled 2015-11-03: qty 1

## 2015-11-03 MED ORDER — TUBERCULIN PPD 5 UNIT/0.1ML ID SOLN
5.0000 [IU] | Freq: Once | INTRADERMAL | Status: DC
  Filled 2015-11-03: qty 0.1

## 2015-11-03 NOTE — Progress Notes (Signed)
Patient discharged from unit at approximately 1510.  Patient denies SI/AVH/HI.  Patient appeared to be in no distress at this time. Patient belongings returned and patient acknowledged receipt of those belongings. Patient left via bus.
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
## 2015-11-03 NOTE — Social Work (Signed)
CSW and attending psychiatrist, Dr. Jennet MaduroPucilowska completed [facility]. Ernest Mallickia Hawkins, director of [facility] @ [phone] stated that patient is scheduled to interview with the executive director and administrative team October 12th at 9:30AM. Pt [facility]. Pt's friend, Jamelle HaringSnow will allow patient to stay with her until her intake [facility].  CSW faxed needed documentation [facility] @ [phone]) [phone].     Lynden OxfordKadijah R. Aylani Spurlock, MSW, LCSW-A 11/03/2015, 2:57PM

## 2015-11-03 NOTE — BHH Group Notes (Signed)
[facility] LCSW Group Therapy   11/03/2015  9:30 am   Type of Therapy: Group Therapy   Participation Level: Active   Participation Quality: Attentive, Sharing and Supportive   Affect: Depressed and Flat   Cognitive: Alert and Oriented   Insight: Developing/Improving and Engaged   Engagement in Therapy: Developing/Improving and Engaged   Modes of Intervention: Clarification, Confrontation, Discussion, Education, Exploration, Limit-setting, Orientation, Problem-solving, Rapport Building, Dance movement psychotherapisteality Testing, Socialization and Support   Summary of Progress/Problems: The topic for group today was emotional regulation. This group focused on both positive and negative emotion identification and allowed  group members to process ways to identify feelings, regulate negative emotions, and find healthy ways to manage internal/external emotions. Group members were asked to reflect on a time when their reaction to an emotion led to a negative outcome and explored how alternative responses using emotion regulation would have benefited them. Group members were also asked to discuss a time when emotion regulation was utilized when a negative emotion was experienced. Pt was asked the pt's primary mechanism for emotional regulation and pt stated the pt "I don't know" after some hesitation, then about fifteen minutes into the group session the pt abruptly left the group and did not return.  Before leaving the pt cooperative with the CSW and other group members.  As the pt left the pt presented as tearful and angry.      Dorothe PeaJonathan F. Sony Schlarb, MSW, LCSWA, LCAS

## 2015-11-03 NOTE — Progress Notes (Signed)
  [facility] :  Will you be returning to the same living situation after discharge:  No. At discharge, do you have transportation home?: Yes,  [facility] bus Do you have the ability to pay for your medications: No.  Release of information consent forms completed and in the chart;  Patient's signature needed at discharge.  Patient to Follow up at: Follow-up Information    [facility]. Go on 11/05/2015.   Why:  Please arrive to your hospital follow-up appointment [facility]. It is important that your bring your discharge paperwork for medication management and therapy. If you have any questions or concerns, contact [facility]. Contact information: [address] [place], [place] [address] Phone: [phone] Fax: [phone]       [facility]. Go on 11/11/2015.   Why:  [facility] your intake interview with Inetta Fermoina and the director on Thursday, Oct. 12th at 9:30AM. Please bring your ID, Social Security card, and birth certificate (proof). Please arrive on time to this appointment 15 minutes early! Contact information: Address: [address] [place], [place] [address] Phone: [phone] Fax: [phone]          Next level of care provider has access to [facility] Link:no  Safety Planning and Suicide Prevention discussed: Yes,  SPE reviewed with patient and friend, [employer]  Have you used any form of tobacco in the last 30 days? (Cigarettes, Smokeless Tobacco, Cigars, and/or Pipes): Yes  Has patient been referred to the Quitline?: Patient refused referral  Patient has been referred for addiction treatment: Yes - [facility] facility.  Lynden OxfordKadijah R Bryttany Tortorelli, MSW, LCSW-A 11/03/2015, 2:57 PM
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
## 2015-12-27 LAB — OB RESULTS CONSOLE ANTIBODY SCREEN: Antibody Screen: NEGATIVE

## 2015-12-27 LAB — OB RESULTS CONSOLE VARICELLA ZOSTER ANTIBODY, IGG: VARICELLA IGG: IMMUNE

## 2015-12-27 LAB — OB RESULTS CONSOLE HGB/HCT, BLOOD
HEMATOCRIT: 33 %
Hemoglobin: 10.8 g/dL

## 2015-12-27 LAB — OB RESULTS CONSOLE RPR: RPR: NONREACTIVE

## 2015-12-27 LAB — OB RESULTS CONSOLE ABO/RH: RH TYPE: POSITIVE

## 2015-12-27 LAB — OB RESULTS CONSOLE PLATELET COUNT: Platelets: 287 10*3/uL

## 2015-12-27 LAB — CYTOLOGY - PAP: PAP SMEAR: ABNORMAL — AB

## 2015-12-27 LAB — OB RESULTS CONSOLE GC/CHLAMYDIA
Chlamydia: NEGATIVE
Gonorrhea: NEGATIVE

## 2015-12-27 LAB — CYSTIC FIBROSIS DIAGNOSTIC STUDY: Interpretation-CFDNA:: NEGATIVE

## 2016-01-03 ENCOUNTER — Encounter: Payer: Self-pay | Admitting: *Deleted

## 2016-01-04 ENCOUNTER — Encounter: Payer: Self-pay | Admitting: Obstetrics and Gynecology

## 2016-01-13 ENCOUNTER — Encounter: Payer: Self-pay | Admitting: Obstetrics & Gynecology

## 2016-01-13 ENCOUNTER — Ambulatory Visit (INDEPENDENT_AMBULATORY_CARE_PROVIDER_SITE_OTHER): Payer: Medicaid Other | Admitting: Family

## 2016-01-13 ENCOUNTER — Ambulatory Visit (INDEPENDENT_AMBULATORY_CARE_PROVIDER_SITE_OTHER): Payer: Self-pay | Admitting: Clinical

## 2016-01-13 VITALS — BP 109/48 | HR 83 | Wt 194.4 lb

## 2016-01-13 DIAGNOSIS — E039 Hypothyroidism, unspecified: Secondary | ICD-10-CM | POA: Diagnosis not present

## 2016-01-13 DIAGNOSIS — O0990 Supervision of high risk pregnancy, unspecified, unspecified trimester: Secondary | ICD-10-CM | POA: Insufficient documentation

## 2016-01-13 DIAGNOSIS — F4312 Post-traumatic stress disorder, chronic: Secondary | ICD-10-CM

## 2016-01-13 DIAGNOSIS — O23592 Infection of other part of genital tract in pregnancy, second trimester: Secondary | ICD-10-CM | POA: Diagnosis not present

## 2016-01-13 DIAGNOSIS — O0972 Supervision of high risk pregnancy due to social problems, second trimester: Secondary | ICD-10-CM

## 2016-01-13 DIAGNOSIS — F332 Major depressive disorder, recurrent severe without psychotic features: Secondary | ICD-10-CM

## 2016-01-13 DIAGNOSIS — A5901 Trichomonal vulvovaginitis: Secondary | ICD-10-CM | POA: Insufficient documentation

## 2016-01-13 DIAGNOSIS — O23599 Infection of other part of genital tract in pregnancy, unspecified trimester: Secondary | ICD-10-CM

## 2016-01-13 DIAGNOSIS — O99282 Endocrine, nutritional and metabolic diseases complicating pregnancy, second trimester: Secondary | ICD-10-CM | POA: Diagnosis not present

## 2016-01-13 DIAGNOSIS — O99280 Endocrine, nutritional and metabolic diseases complicating pregnancy, unspecified trimester: Secondary | ICD-10-CM

## 2016-01-13 LAB — POCT URINALYSIS DIP (DEVICE)
Bilirubin Urine: NEGATIVE
GLUCOSE, UA: NEGATIVE mg/dL
Hgb urine dipstick: NEGATIVE
Ketones, ur: NEGATIVE mg/dL
NITRITE: NEGATIVE
PH: 6.5 (ref 5.0–8.0)
PROTEIN: NEGATIVE mg/dL
Specific Gravity, Urine: 1.02 (ref 1.005–1.030)
UROBILINOGEN UA: 1 mg/dL (ref 0.0–1.0)

## 2016-01-13 MED ORDER — SERTRALINE HCL 50 MG PO TABS: 50.0000 mg | ORAL_TABLET | Freq: Every day | ORAL | 1 refills | Status: DC

## 2016-01-13 MED ORDER — METRONIDAZOLE 500 MG PO TABS: 500.0000 mg | ORAL_TABLET | Freq: Two times a day (BID) | ORAL | 0 refills | Status: DC

## 2016-01-13 MED ORDER — LEVOTHYROXINE SODIUM 100 MCG PO TABS: 100.0000 ug | ORAL_TABLET | Freq: Every day | ORAL | 1 refills | Status: DC

## 2016-01-13 NOTE — Patient Instructions (Signed)
Second Trimester of Pregnancy The second trimester is from week 13 through week 28 (months 4 through 6). The second trimester is often a time when you feel your best. Your body has also adjusted to being pregnant, and you begin to feel better physically. Usually, morning sickness has lessened or quit completely, you may have more energy, and you may have an increase in appetite. The second trimester is also a time when the fetus is growing rapidly. At the end of the sixth month, the fetus is about 9 inches long and weighs about 1 pounds. You will likely begin to feel the baby move (quickening) between 18 and 20 weeks of the pregnancy. Body changes during your second trimester Your body continues to go through many changes during your second trimester. The changes vary from woman to woman.  Your weight will continue to increase. You will notice your lower abdomen bulging out.  You may begin to get stretch marks on your hips, abdomen, and breasts.  You may develop headaches that can be relieved by medicines. The medicines should be approved by your health care provider.  You may urinate more often because the fetus is pressing on your bladder.  You may develop or continue to have heartburn as a result of your pregnancy.  You may develop constipation because certain hormones are causing the muscles that push waste through your intestines to slow down.  You may develop hemorrhoids or swollen, bulging veins (varicose veins).  You may have back pain. This is caused by:  Weight gain.  Pregnancy hormones that are relaxing the joints in your pelvis.  A shift in weight and the muscles that support your balance.  Your breasts will continue to grow and they will continue to become tender.  Your gums may bleed and may be sensitive to brushing and flossing.  Dark spots or blotches (chloasma, mask of pregnancy) may develop on your face. This will likely fade after the baby is born.  A dark line  from your belly button to the pubic area (linea nigra) may appear. This will likely fade after the baby is born.  You may have changes in your hair. These can include thickening of your hair, rapid growth, and changes in texture. Some women also have hair loss during or after pregnancy, or hair that feels dry or thin. Your hair will most likely return to normal after your baby is born. What to expect at prenatal visits During a routine prenatal visit:  You will be weighed to make sure you and the fetus are growing normally.  Your blood pressure will be taken.  Your abdomen will be measured to track your baby's growth.  The fetal heartbeat will be listened to.  Any test results from the previous visit will be discussed. Your health care provider may ask you:  How you are feeling.  If you are feeling the baby move.  If you have had any abnormal symptoms, such as leaking fluid, bleeding, severe headaches, or abdominal cramping.  If you are using any tobacco products, including cigarettes, chewing tobacco, and electronic cigarettes.  If you have any questions. Other tests that may be performed during your second trimester include:  Blood tests that check for:  Low iron levels (anemia).  Gestational diabetes (between 24 and 28 weeks).  Rh antibodies. This is to check for a protein on red blood cells (Rh factor).  Urine tests to check for infections, diabetes, or protein in the urine.  An ultrasound to   confirm the proper growth and development of the baby.  An amniocentesis to check for possible genetic problems.  Fetal screens for spina bifida and Down syndrome.  HIV (human immunodeficiency virus) testing. Routine prenatal testing includes screening for HIV, unless you choose not to have this test. Follow these instructions at home: Eating and drinking  Continue to eat regular, healthy meals.  Avoid raw meat, uncooked cheese, cat litter boxes, and soil used by cats. These  carry germs that can cause birth defects in the baby.  Take your prenatal vitamins.  Take 1500-2000 mg of calcium daily starting at the 20th week of pregnancy until you deliver your baby.  If you develop constipation:  Take over-the-counter or prescription medicines.  Drink enough fluid to keep your urine clear or pale yellow.  Eat foods that are high in fiber, such as fresh fruits and vegetables, whole grains, and beans.  Limit foods that are high in fat and processed sugars, such as fried and sweet foods. Activity  Exercise only as directed by your health care provider. Experiencing uterine cramps is a good sign to stop exercising.  Avoid heavy lifting, wear low heel shoes, and practice good posture.  Wear your seat belt at all times when driving.  Rest with your legs elevated if you have leg cramps or low back pain.  Wear a good support bra for breast tenderness.  Do not use hot tubs, steam rooms, or saunas. Lifestyle  Avoid all smoking, herbs, alcohol, and unprescribed drugs. These chemicals affect the formation and growth of the baby.  Do not use any products that contain nicotine or tobacco, such as cigarettes and e-cigarettes. If you need help quitting, ask your health care provider.  A sexual relationship may be continued unless your health care provider directs you otherwise. General instructions  Follow your health care provider's instructions regarding medicine use. There are medicines that are either safe or unsafe to take during pregnancy.  Take warm sitz baths to soothe any pain or discomfort caused by hemorrhoids. Use hemorrhoid cream if your health care provider approves.  If you develop varicose veins, wear support hose. Elevate your feet for 15 minutes, 3-4 times a day. Limit salt in your diet.  Visit your dentist if you have not gone yet during your pregnancy. Use a soft toothbrush to brush your teeth and be gentle when you floss.  Keep all follow-up  prenatal visits as told by your health care provider. This is important. Contact a health care provider if:  You have dizziness.  You have mild pelvic cramps, pelvic pressure, or nagging pain in the abdominal area.  You have persistent nausea, vomiting, or diarrhea.  You have a bad smelling vaginal discharge.  You have pain with urination. Get help right away if:  You have a fever.  You are leaking fluid from your vagina.  You have spotting or bleeding from your vagina.  You have severe abdominal cramping or pain.  You have rapid weight gain or weight loss.  You have shortness of breath with chest pain.  You notice sudden or extreme swelling of your face, hands, ankles, feet, or legs.  You have not felt your baby move in over an hour.  You have severe headaches that do not go away with medicine.  You have vision changes. Summary  The second trimester is from week 13 through week 28 (months 4 through 6). It is also a time when the fetus is growing rapidly.  Your body goes   through many changes during pregnancy. The changes vary from woman to woman.  Avoid all smoking, herbs, alcohol, and unprescribed drugs. These chemicals affect the formation and growth your baby.  Do not use any tobacco products, such as cigarettes, chewing tobacco, and e-cigarettes. If you need help quitting, ask your health care provider.  Contact your health care provider if you have any questions. Keep all prenatal visits as told by your health care provider. This is important. This information is not intended to replace advice given to you by your health care provider. Make sure you discuss any questions you have with your health care provider. Document Released: 01/10/2001 Document Revised: 06/24/2015 Document Reviewed: 03/19/2012 Elsevier Interactive Patient Education  2017 Elsevier Inc.  
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
## 2016-01-13 NOTE — BH Specialist Note (Signed)
Session Start time: 9:30   End Time: 10:15 Total Time:  45 minutes Type of Service: Behavioral Health - Individual/Family Interpreter: No.   Interpreter Name & Language: n/a # [facility]June [facility]  SUBJECTIVE: Mary Ramirez is a 35 y.o. female  Pt. was referred by Rochele PagesWalidah Karim, CNM for:  Psychosocial, Untreated BH. Pt. reports the following symptoms/concerns: Pt states her primary concerns are experiencing homelessness and lack of transportation, along with being concerned about STD harming baby. Pt expresses numerous life traumas from childhood to present, does have a case manager working on psychosocial issues, [facility], and has support from at least one local friend. Pt uncertain about [facility] for either psychiatry and/or therapy. NO SI or HI today.  Duration of problem:  Trauma over 20 years, current anxious feelings, about 3 months Severity: moderate Previous treatment: Yes, [facility]  OBJECTIVE: Mood: Irritable & Affect: Appropriate Risk of harm to self or others: Low risk of harm to self today, no SI today. Pt states "really irritated about having this STD" because she does not want it to negatively affect her baby, but not suicidal Assessments administered: PHQ9: 4/ GAD7: 11  LIFE CONTEXT:  Family & Social: Experiencing homelessness, one friend local; family estranged School/ Work: None Self-Care: Tobacco, no alcohol or other substances since pregnancy, advocates well for self, sleeps and eats well  Life changes: Current pregnancy What is important to pt/family (values): Healthy baby, finding housing before baby is born  GOALS ADDRESSED:  -Coping with life stress  INTERVENTIONS: Motivational Interviewing and Strength-based   ASSESSMENT:  Pt currently experiencing Chronic post-traumatic stress disorder. Pt may benefit from psychoeducation and brief therapeutic interventions regarding coping with symptoms of PTSD.   PLAN: 1.  F/U with behavioral health clinician: One month, or as needed 2. Behavioral Health meds: (not taking Latuda, Zoloft, Ambien) 3. Behavioral recommendations:  -[facility] via [facility] -Consider reading educational material regarding coping with symptoms of depression and anxiety(with panic attacks) -Continue working with current Sports coachcase manager, and continue accessing [facility] services daily 4. Referral: Brief Counseling/Psychotherapy, Publishing rights managerCommunity Resource and Psychoeducation 5. From scale of 1-10, how likely are you to follow plan: 7  [facility]  Behavioral Health Clinician  Marlon PelWarmhandoff:   Warm Hand Off Completed.         Depression screen PHQ 2/9 01/13/2016  Decreased Interest 1  Down, Depressed, Hopeless 0  PHQ - 2 Score 1  Altered sleeping 0  Tired, decreased energy 0  Change in appetite 0  Feeling bad or failure about yourself  0  Trouble concentrating 1  Moving slowly or fidgety/restless 0  Suicidal thoughts 2  PHQ-9 Score 4   GAD 7 : Generalized Anxiety Score 01/13/2016  Nervous, Anxious, on Edge 0  Control/stop worrying 1  Worry too much - different things 0  Trouble relaxing 3  Restless 1  Easily annoyed or irritable 3  Afraid - awful might happen 3  Total GAD 7 Score 11

## 2016-01-17 ENCOUNTER — Other Ambulatory Visit: Payer: Self-pay

## 2016-01-18 ENCOUNTER — Encounter: Payer: Self-pay | Admitting: General Practice

## 2016-01-18 DIAGNOSIS — A5901 Trichomonal vulvovaginitis: Secondary | ICD-10-CM

## 2016-01-18 DIAGNOSIS — O0990 Supervision of high risk pregnancy, unspecified, unspecified trimester: Secondary | ICD-10-CM

## 2016-01-18 DIAGNOSIS — O23592 Infection of other part of genital tract in pregnancy, second trimester: Secondary | ICD-10-CM

## 2016-01-18 DIAGNOSIS — O99280 Endocrine, nutritional and metabolic diseases complicating pregnancy, unspecified trimester: Secondary | ICD-10-CM

## 2016-01-18 DIAGNOSIS — E039 Hypothyroidism, unspecified: Secondary | ICD-10-CM

## 2016-01-31 NOTE — L&D Delivery Note (Signed)
Delivery Note Admitted in spontaneous labor, progressed to complete on her own. Called by RN d/t station still -2 w/ urge to push, bbow. AROM large amt clear fluid, then pushed x 2 contractions to birth.  At 4:14 AM a viable female was delivered via Vaginal, Spontaneous Delivery (Presentation: LOA ).  APGAR: 7, 9; weight: pending at time of birth. Spontaneous infant placed directly on mom's abdomen for bonding/skin-to-skin. Delayed cord clamping, then cord clamped x 2, and cut by me- per pt's request.     Placenta status: delivered spontaneously intact .  Cord: 3VC with the following complications: short.  Cord pH: n/a Baby has flattened helixes of the ears  Anesthesia:  epidural Episiotomy: None Lacerations: 1st degree;Perineal, Lt sulcal- both repaired, bilateral periurethral hemostatic- not repaired Suture Repair: 3.0 vicryl Est. Blood Loss (mL):  100  Mom to postpartum> Mag x 24hrs.   Baby to Couplet care / Skin to Skin. Placenta to path.  Pt plans in-hospital BTL, wants to get small meal right now and plans BTL later in day- NPO after her meal Plans to bottlefeed, no circ  Marge Duncans 05/10/2016, 4:40 AM

## 2016-02-10 ENCOUNTER — Encounter: Payer: Self-pay | Admitting: Family Medicine

## 2016-03-06 ENCOUNTER — Ambulatory Visit: Payer: Self-pay | Admitting: Clinical

## 2016-03-06 ENCOUNTER — Ambulatory Visit (INDEPENDENT_AMBULATORY_CARE_PROVIDER_SITE_OTHER): Payer: Self-pay | Admitting: Family Medicine

## 2016-03-06 VITALS — BP 113/59 | HR 80 | Wt 193.5 lb

## 2016-03-06 DIAGNOSIS — O99282 Endocrine, nutritional and metabolic diseases complicating pregnancy, second trimester: Secondary | ICD-10-CM

## 2016-03-06 DIAGNOSIS — Z5900 Homelessness unspecified: Secondary | ICD-10-CM

## 2016-03-06 DIAGNOSIS — E039 Hypothyroidism, unspecified: Secondary | ICD-10-CM

## 2016-03-06 DIAGNOSIS — O0973 Supervision of high risk pregnancy due to social problems, third trimester: Secondary | ICD-10-CM

## 2016-03-06 DIAGNOSIS — O99283 Endocrine, nutritional and metabolic diseases complicating pregnancy, third trimester: Secondary | ICD-10-CM

## 2016-03-06 LAB — CBC
HEMATOCRIT: 31.4 % — AB (ref 35.0–45.0)
HEMOGLOBIN: 10.4 g/dL — AB (ref 11.7–15.5)
MCH: 28.9 pg (ref 27.0–33.0)
MCHC: 33.1 g/dL (ref 32.0–36.0)
MCV: 87.2 fL (ref 80.0–100.0)
MPV: 10.3 fL (ref 7.5–12.5)
Platelets: 290 10*3/uL (ref 140–400)
RBC: 3.6 MIL/uL — AB (ref 3.80–5.10)
RDW: 12.6 % (ref 11.0–15.0)
WBC: 12.8 10*3/uL — ABNORMAL HIGH (ref 3.8–10.8)

## 2016-03-06 MED ORDER — LEVOTHYROXINE SODIUM 100 MCG PO TABS: 100.0000 ug | ORAL_TABLET | Freq: Every day | ORAL | 1 refills | Status: DC

## 2016-03-06 MED ORDER — PRENATAL PLUS 27-1 MG PO TABS: 1.0000 | ORAL_TABLET | Freq: Every day | ORAL | 1 refills | Status: DC

## 2016-03-06 NOTE — BH Specialist Note (Signed)
Session Start time: 3:30 End Time: 3:50 Total Time:  20 minutes Type of Service: Behavioral Health - Individual/Family Interpreter: No.   Interpreter Name & Language: n/a # [facility]  SUBJECTIVE: Mary Ramirez is a 36 y.o. female  Pt. was referred by Dr Adrian BlackwaterStinson for:  homelessness. Pt. reports the following symptoms/concerns: Pt states that her primary concerns today are being hungry, homeless, and tired; she will feel better with food and a nap.Pt is [facility] about [facility], and is here today with [facility]. Duration of problem:  Today Severity: mild Previous treatment: Yes, [facility], will not go back  OBJECTIVE: Mood: Appropriate & Affect: Appropriate Risk of harm to self or others: No risk of harm to self today Assessments administered: PHQ9: 13/ GAD7: 16  LIFE CONTEXT:  Family & Social: Homeless, outside, awaiting family housing, has Teacher, English as a foreign languageATH team, case Teacher, English as a foreign languagemanager(s) (Who,family proximity, relationship, friends) Product/process development scientistchool/ Work: Unemployed (Where, how often, or financial support) Self-Care: - (Exercise, sleep, eat, substances) Life changes: Current pregnancy What is important to pt/family (values): Getting into housing before baby arrives  GOALS ADDRESSED:  -Eat and take nap prior to leaving hospital  INTERVENTIONS: Motivational Interviewing and Supportive   ASSESSMENT:  Pt currently experiencing Homelessness.  Pt may benefit from food and sleep today.   PLAN: 1. F/U with behavioral health clinician: As needed 2. Behavioral Health meds: none 3. Behavioral recommendations:  -Take nap prior to final lab work today -Eat peanut butter and crackers prior to leaving  -Continue to work with case Production designer, theatre/television/filmmanager on housing -[facility], [facility] and [facility] case manager 4. Referral: Supportive Counseling 5. From scale of 1-10, how likely are you  to follow plan: 10  Rae LipsJamie C Mcmannes LCSWA Behavioral Health Clinician  Marlon PelWarmhandoff: no  Depression screen [facility] 2/9 01/13/2016  Decreased Interest 1  Down, Depressed, Hopeless 0  PHQ - 2 Score 1  Altered sleeping 0  Tired, decreased energy 0  Change in appetite 0  Feeling bad or failure about yourself  0  Trouble concentrating 1  Moving slowly or fidgety/restless 0  Suicidal thoughts 2  PHQ-9 Score 4   GAD 7 : Generalized Anxiety Score 01/13/2016  Nervous, Anxious, on Edge 0  Control/stop worrying 1  Worry too much - different things 0  Trouble relaxing 3  Restless 1  Easily annoyed or irritable 3  Afraid - awful might happen 3  Total GAD 7 Score 11

## 2016-03-06 NOTE — Progress Notes (Signed)
    PRENATAL VISIT NOTE  Subjective:  Salley Hewsdonya Duda is a 36 y.o. [id number] at 716w0d being seen today for ongoing prenatal care.  She is currently monitored for the following issues for this high-risk pregnancy and has Severe recurrent major depressive disorder with psychotic features (HCC); Cannabis use disorder, moderate, dependence (HCC); Suicidal ideation; Pregnant; Tobacco use disorder; Hypothyroidism; PTSD (post-traumatic stress disorder); Developmental disability; Supervision of high risk pregnancy due to Social Issues; Trichomonal vaginitis during pregnancy; and Hypothyroidism affecting pregnancy, antepartum on her problem list.  Patient reports no complaints.  Contractions: Not present. Vag. Bleeding: None.  Movement: Present. Denies leaking of fluid.   The following portions of the patient's history were reviewed and updated as appropriate: allergies, current medications, past family history, past medical history, past social history, past surgical history and problem list. Problem list updated.  Objective:   Vitals:   03/06/16 1456  BP: (!) 113/59  Pulse: 80  Weight: 193 lb 8 oz (87.8 kg)    Fetal Status: Fetal Heart Rate (bpm): 140 Fundal Height: 30 cm Movement: Present     General:  Alert, oriented and cooperative. Patient is in no acute distress.  Skin: Skin is warm and dry. No rash noted.   Cardiovascular: Normal heart rate noted  Respiratory: Normal respiratory effort, no problems with respiration noted  Abdomen: Soft, gravid, appropriate for gestational age. Pain/Pressure: Absent     Pelvic:  Cervical exam deferred        Extremities: Normal range of motion.  Edema: None  Mental Status: Normal mood and affect. Normal behavior. Normal judgment and thought content.   Assessment and Plan:  Pregnancy: [id number] at 4816w0d  1. Supervision of high risk pregnancy due to social problems in third trimester 28 week labs - 1hr GTT done. US scheduled. BTL paperwork signed. - US  MFM OB COMP + 14 WK; Future - CBC - RPR - HIV antibody (with reflex) - Glucose Tolerance, 1 HR (50g) w/o Fasting - TSH  2. Hypothyroidism, unspecified type TSH today  3. Hypothyroidism affecting pregnancy in second trimester - levothyroxine (SYNTHROID, LEVOTHROID) 100 MCG tablet; Take 1 tablet (100 mcg total) by mouth daily before breakfast.  Dispense: 30 tablet; Refill: 1  Preterm labor symptoms and general obstetric precautions including but not limited to vaginal bleeding, contractions, leaking of fluid and fetal movement were reviewed in detail with the patient. Please refer to After Visit Summary for other counseling recommendations.  Return in about 8 days (around 03/14/2016).   Levie HeritageJacob J Stinson, DO

## 2016-03-06 NOTE — Progress Notes (Signed)
Pt does not take any medications she doesn't have any way to pay for it. She is in the process of getting her medicaid.

## 2016-03-07 ENCOUNTER — Other Ambulatory Visit: Payer: Self-pay | Admitting: Family Medicine

## 2016-03-07 ENCOUNTER — Encounter: Payer: Self-pay | Admitting: General Practice

## 2016-03-07 DIAGNOSIS — E039 Hypothyroidism, unspecified: Secondary | ICD-10-CM

## 2016-03-07 DIAGNOSIS — O99282 Endocrine, nutritional and metabolic diseases complicating pregnancy, second trimester: Principal | ICD-10-CM

## 2016-03-07 LAB — GLUCOSE TOLERANCE, 1 HOUR (50G) W/O FASTING: Glucose, 1 Hr, gestational: 181 mg/dL — ABNORMAL HIGH (ref <140)

## 2016-03-07 LAB — RPR

## 2016-03-07 LAB — TSH: TSH: 6.39 mIU/L — ABNORMAL HIGH

## 2016-03-07 LAB — HIV ANTIBODY (ROUTINE TESTING W REFLEX): HIV 1&2 Ab, 4th Generation: NONREACTIVE

## 2016-03-07 MED ORDER — LEVOTHYROXINE SODIUM 125 MCG PO TABS: 125.0000 ug | ORAL_TABLET | Freq: Every day | ORAL | 3 refills | Status: DC

## 2016-03-08 ENCOUNTER — Telehealth: Payer: Self-pay | Admitting: *Deleted

## 2016-03-08 NOTE — Telephone Encounter (Signed)
Per Dr. Adrian BlackwaterStinson, pt needs to be informed of test results and plan of care. She needs 3hr GTT performed. She also has elevated TSH and the thyroid mediicine needs to be increased to 125 mcg per day. New Rx has been sent to her pharmacy. Pt does not have a phone as she is homeless. I called the contact person "Jamelle HaringSnow" and learned that she is not a family member but is an acquaintance who is trying to help pt by providing food at times and checking on her due to homelessness. She will give pt the message to call our office and ask to speak with a nurse at the time of her call.

## 2016-03-14 ENCOUNTER — Ambulatory Visit (INDEPENDENT_AMBULATORY_CARE_PROVIDER_SITE_OTHER): Payer: Medicaid Other | Admitting: Clinical

## 2016-03-14 ENCOUNTER — Other Ambulatory Visit: Payer: Self-pay | Admitting: Family Medicine

## 2016-03-14 ENCOUNTER — Ambulatory Visit (HOSPITAL_COMMUNITY)
Admission: RE | Admit: 2016-03-14 | Discharge: 2016-03-14 | Disposition: A | Discharge status: Home / Self Care | Payer: Medicaid Other | Source: Ambulatory Visit | Attending: Family Medicine | Admitting: Family Medicine

## 2016-03-14 ENCOUNTER — Encounter: Payer: Self-pay | Admitting: Obstetrics and Gynecology

## 2016-03-14 ENCOUNTER — Ambulatory Visit (INDEPENDENT_AMBULATORY_CARE_PROVIDER_SITE_OTHER): Payer: Medicaid Other | Admitting: Obstetrics and Gynecology

## 2016-03-14 ENCOUNTER — Encounter (HOSPITAL_COMMUNITY): Payer: Self-pay

## 2016-03-14 VITALS — BP 128/83 | HR 93

## 2016-03-14 DIAGNOSIS — A5901 Trichomonal vulvovaginitis: Secondary | ICD-10-CM

## 2016-03-14 DIAGNOSIS — N879 Dysplasia of cervix uteri, unspecified: Secondary | ICD-10-CM | POA: Insufficient documentation

## 2016-03-14 DIAGNOSIS — O35EXX0 Maternal care for other (suspected) fetal abnormality and damage, fetal genitourinary anomalies, not applicable or unspecified: Secondary | ICD-10-CM

## 2016-03-14 DIAGNOSIS — O99283 Endocrine, nutritional and metabolic diseases complicating pregnancy, third trimester: Secondary | ICD-10-CM

## 2016-03-14 DIAGNOSIS — Z5900 Homelessness unspecified: Secondary | ICD-10-CM | POA: Insufficient documentation

## 2016-03-14 DIAGNOSIS — E039 Hypothyroidism, unspecified: Secondary | ICD-10-CM

## 2016-03-14 DIAGNOSIS — F4321 Adjustment disorder with depressed mood: Secondary | ICD-10-CM

## 2016-03-14 DIAGNOSIS — O99323 Drug use complicating pregnancy, third trimester: Secondary | ICD-10-CM | POA: Diagnosis not present

## 2016-03-14 DIAGNOSIS — Z8619 Personal history of other infectious and parasitic diseases: Secondary | ICD-10-CM

## 2016-03-14 DIAGNOSIS — O358XX0 Maternal care for other (suspected) fetal abnormality and damage, not applicable or unspecified: Secondary | ICD-10-CM | POA: Insufficient documentation

## 2016-03-14 DIAGNOSIS — O99280 Endocrine, nutritional and metabolic diseases complicating pregnancy, unspecified trimester: Secondary | ICD-10-CM

## 2016-03-14 DIAGNOSIS — Z3A31 31 weeks gestation of pregnancy: Secondary | ICD-10-CM | POA: Diagnosis not present

## 2016-03-14 DIAGNOSIS — O0990 Supervision of high risk pregnancy, unspecified, unspecified trimester: Secondary | ICD-10-CM

## 2016-03-14 DIAGNOSIS — R87612 Low grade squamous intraepithelial lesion on cytologic smear of cervix (LGSIL): Secondary | ICD-10-CM

## 2016-03-14 DIAGNOSIS — O359XX0 Maternal care for (suspected) fetal abnormality and damage, unspecified, not applicable or unspecified: Secondary | ICD-10-CM

## 2016-03-14 DIAGNOSIS — F4320 Adjustment disorder, unspecified: Secondary | ICD-10-CM

## 2016-03-14 DIAGNOSIS — O23592 Infection of other part of genital tract in pregnancy, second trimester: Secondary | ICD-10-CM

## 2016-03-14 DIAGNOSIS — O23593 Infection of other part of genital tract in pregnancy, third trimester: Secondary | ICD-10-CM

## 2016-03-14 DIAGNOSIS — O0973 Supervision of high risk pregnancy due to social problems, third trimester: Secondary | ICD-10-CM

## 2016-03-14 DIAGNOSIS — Z3689 Encounter for other specified antenatal screening: Secondary | ICD-10-CM

## 2016-03-14 DIAGNOSIS — Z113 Encounter for screening for infections with a predominantly sexual mode of transmission: Secondary | ICD-10-CM

## 2016-03-14 DIAGNOSIS — O99810 Abnormal glucose complicating pregnancy: Secondary | ICD-10-CM

## 2016-03-14 NOTE — Progress Notes (Signed)
Pt reports not taking thryoid medication due to insurance issues.

## 2016-03-14 NOTE — Progress Notes (Signed)
Prenatal Visit Note Date: 03/14/2016 Clinic: [facility]  Subjective:  Mary Ramirez is a 36 y.o. [id number] at 2261w1d being seen today for ongoing prenatal care.  She is currently monitored for the following issues for this high-risk pregnancy and has Severe recurrent major depressive disorder with psychotic features (HCC); Cannabis use disorder, moderate, dependence (HCC); Suicidal ideation; Tobacco use disorder; Hypothyroidism; PTSD (post-traumatic stress disorder); Developmental disability; Supervision of high risk pregnancy due to Social Issues; Trichomonal vaginitis during pregnancy; Hypothyroidism affecting pregnancy, antepartum; failed 1hr GCT; Homelessness; LGSIL on Pap smear of cervix; and Encounter for repeat ultrasound of fetal pyelectasis, antepartum on her problem list.  Patient reports no complaints.   Contractions: Not present. Vag. Bleeding: None.  Movement: Present. Denies leaking of fluid.   The following portions of the patient's history were reviewed and updated as appropriate: allergies, current medications, past family history, past medical history, past social history, past surgical history and problem list. Problem list updated.  Objective:   Vitals:   03/14/16 1526  BP: 128/83  Pulse: 93    Fetal Status: Fetal Heart Rate (bpm): 157   Movement: Present  Presentation: Vertex  General:  Alert, oriented and cooperative. Patient is in no acute distress.  Skin: Skin is warm and dry. No rash noted.   Cardiovascular: Normal heart rate noted  Respiratory: Normal respiratory effort, no problems with respiration noted  Abdomen: Soft, gravid, appropriate for gestational age. Pain/Pressure: Absent     Pelvic:  Cervical exam deferred        Extremities: Normal range of motion.  Edema: None  Mental Status: Normal mood and affect. Normal behavior. Normal judgment and thought content.   Urinalysis:      Assessment and Plan:  Pregnancy: [id number] at  8661w1d  1. History of trichomoniasis TOC today - Cervicovaginal ancillary only  2. Encounter for repeat ultrasound of fetal pyelectasis, antepartum, single or unspecified fetus New diagnosis on mfm anatomy scan today. Rpt in 4wks. Tell peds at delivery - US MFM OB FOLLOW UP; Future  3. failed 1hr GCT Pt unable to do 3hr today. D/w her re: importance and can come in whenever is convenient and doesn't need to wait for nv   4. Hypothyroidism, unspecified type D/w her re: importance of this. Pt unable to afford even on WM $4 list. Medicaid pending resources for HD and community health and wellness for medication assistance given to patient  5. Hypothyroidism affecting pregnancy, antepartum See above  6. Trichomonal vaginitis during pregnancy in third trimester See above  7. LGSIL on Pap smear of cervix colpo PP  8. Supervision of high risk pregnancy due to Social Issues Has case manager and pt working on housing currently. Currently doesn't have stable place to stay. To see Asher MuirJamie SW today. BTL papers already signed  Preterm labor symptoms and general obstetric precautions including but not limited to vaginal bleeding, contractions, leaking of fluid and fetal movement were reviewed in detail with the patient. Please refer to After Visit Summary for other counseling recommendations.  Return in about 2 weeks (around 03/28/2016) for rob.   Benton Bingharlie Genice Kimberlin, MD

## 2016-03-14 NOTE — BH Specialist Note (Signed)
Session Start time: 4:00   End Time: 5:00 Total Time:  60 minutes Type of Service: Behavioral Health - Individual/Family Interpreter: No.   Interpreter Name & Language: n/a # [facility]June 2018[facility]  SUBJECTIVE: Mary Ramirez is a 36 y.o. female  Pt. was referred by Dr Vergie LivingPickens for:  Grief. Pt. reports the following symptoms/concerns: Pt's primary concern today is grief, as a good friend passed away by overdose over two weeks prior. Pt also concerned about lack of housing, and worries about not having housing before her baby arrives.  Duration of problem:  Grief, over two weeks Severity: moderate Previous treatment: [facility], will not go back  OBJECTIVE: Mood: Appropriate & Affect: Appropriate Risk of harm to self or others: No risk of harm to self or others Assessments administered: none today  LIFE CONTEXT:  Family & Social: Homeless, outside, working with case Teacher, English as a foreign languagemanager(s) on housing (Who,family proximity, relationship, friends) Product/process development scientistchool/ Work: n/a (Where, how often, or financial support) Self-Care: Staying "clean" (Exercise, sleep, eat, substances) Life changes: Current pregnancy What is important to pt/family (values): Safe housing for baby  GOALS ADDRESSED:  -Work on processing grief of friend today  INTERVENTIONS: Supportive   ASSESSMENT:  Pt currently experiencing Grief and Homelessness.  Pt may benefit from supportive counseling today.   PLAN: 1. F/U with behavioral health clinician: As needed 2. Behavioral Health meds: none 3. Behavioral recommendations:  -Consider hospice grief support for family and friends of loved ones who passed away due to overdose -Continue working with housing case manager 4. Referral: Supportive Counseling 5. From scale of 1-10, how likely are you to follow plan: 7  Rae LipsJamie C Faiz Weber LCSWA Behavioral Health Clinician  Marlon PelWarmhandoff: no  Depression screen [facility] 2/9 03/07/2016 03/07/2016 01/13/2016  Decreased Interest 0 2 1  Down,  Depressed, Hopeless 0 2 0  PHQ - 2 Score 0 4 1  Altered sleeping 0 3 0  Tired, decreased energy 3 3 0  Change in appetite 0 3 0  Feeling bad or failure about yourself  0 0 0  Trouble concentrating 3 0 1  Moving slowly or fidgety/restless 0 0 0  Suicidal thoughts 0 0 2  PHQ-9 Score 6 13 4    GAD 7 : Generalized Anxiety Score 03/07/2016 03/07/2016 01/13/2016  Nervous, Anxious, on Edge 0 0 0  Control/stop worrying 0 3 1  Worry too much - different things 2 3 0  Trouble relaxing 3 3 3   Restless 0 3 1  Easily annoyed or irritable 2 3 3   Afraid - awful might happen 0 1 3  Total GAD 7 Score 7 16 11

## 2016-03-16 NOTE — Telephone Encounter (Signed)
Pt seen in office on 2/13, these issues were discussed with patient per Dr. Vergie LivingPickens.

## 2016-03-17 LAB — CERVICOVAGINAL ANCILLARY ONLY
Chlamydia: NEGATIVE
NEISSERIA GONORRHEA: NEGATIVE
TRICH (WINDOWPATH): NEGATIVE

## 2016-03-21 ENCOUNTER — Other Ambulatory Visit: Payer: Self-pay

## 2016-03-23 ENCOUNTER — Other Ambulatory Visit: Payer: Self-pay

## 2016-03-23 DIAGNOSIS — O24419 Gestational diabetes mellitus in pregnancy, unspecified control: Secondary | ICD-10-CM

## 2016-03-24 LAB — GESTATIONAL GLUCOSE TOLERANCE
GLUCOSE 1 HOUR GTT: 179 mg/dL (ref 65–179)
GLUCOSE 3 HOUR GTT: 126 mg/dL (ref 65–139)
GLUCOSE FASTING: 98 mg/dL — AB (ref 65–94)
Glucose, GTT - 2 Hour: 169 mg/dL — ABNORMAL HIGH (ref 65–154)

## 2016-03-27 ENCOUNTER — Other Ambulatory Visit: Payer: Self-pay | Admitting: Obstetrics and Gynecology

## 2016-03-27 ENCOUNTER — Encounter: Payer: Self-pay | Admitting: Obstetrics and Gynecology

## 2016-03-27 DIAGNOSIS — O24419 Gestational diabetes mellitus in pregnancy, unspecified control: Secondary | ICD-10-CM

## 2016-03-27 MED ORDER — GLUCOSE BLOOD VI STRP: ORAL_STRIP | 12 refills | Status: DC

## 2016-03-27 MED ORDER — ACCU-CHEK FASTCLIX LANCETS MISC: 1.0000 [IU] | Freq: Four times a day (QID) | 12 refills | Status: DC

## 2016-03-27 MED ORDER — ACCU-CHEK NANO SMARTVIEW W/DEVICE KIT: 1.0000 | PACK | 0 refills | Status: DC

## 2016-03-28 ENCOUNTER — Encounter: Payer: Self-pay | Admitting: Obstetrics and Gynecology

## 2016-03-29 ENCOUNTER — Telehealth: Payer: Self-pay | Admitting: General Practice

## 2016-03-29 NOTE — Telephone Encounter (Signed)
Called patient & reached Case Manager Melita who states the patient is homeless and she receives her results. Informed her of 3 hr gtt results & need for [facility] to meet with a diabetes educator. Informed her of appt on 3/1 @ 3pm and that she should come by the office prior to appt to receive testing supplies. Melita states she will inform patient & bring her to the appt tomorrow. She had no questions

## 2016-03-29 NOTE — Telephone Encounter (Signed)
Mary Ramirez called back in with Mary Ramirez stating she cannot make her appt tomorrow because she has things to do. Offered appt 3/8 @ 3pm following office appt. They state they can do that appt.

## 2016-03-30 ENCOUNTER — Ambulatory Visit (HOSPITAL_COMMUNITY): Payer: Self-pay

## 2016-03-30 ENCOUNTER — Encounter: Payer: Self-pay | Admitting: Obstetrics and Gynecology

## 2016-04-06 ENCOUNTER — Encounter: Payer: Self-pay | Admitting: Obstetrics and Gynecology

## 2016-04-06 ENCOUNTER — Ambulatory Visit (HOSPITAL_COMMUNITY): Admission: RE | Admit: 2016-04-06 | Payer: Self-pay | Source: Ambulatory Visit

## 2016-04-11 ENCOUNTER — Ambulatory Visit (HOSPITAL_COMMUNITY): Payer: Self-pay

## 2016-04-12 ENCOUNTER — Ambulatory Visit (HOSPITAL_COMMUNITY): Admission: RE | Admit: 2016-04-12 | Payer: Medicaid Other | Source: Ambulatory Visit

## 2016-04-12 ENCOUNTER — Encounter: Payer: Self-pay | Admitting: Obstetrics and Gynecology

## 2016-04-13 ENCOUNTER — Ambulatory Visit (INDEPENDENT_AMBULATORY_CARE_PROVIDER_SITE_OTHER): Payer: Medicaid Other | Admitting: Obstetrics & Gynecology

## 2016-04-13 ENCOUNTER — Encounter: Payer: Self-pay | Admitting: Obstetrics and Gynecology

## 2016-04-13 ENCOUNTER — Encounter (HOSPITAL_COMMUNITY): Payer: Self-pay

## 2016-04-13 ENCOUNTER — Other Ambulatory Visit (HOSPITAL_COMMUNITY): Payer: Self-pay | Admitting: Maternal and Fetal Medicine

## 2016-04-13 ENCOUNTER — Ambulatory Visit (HOSPITAL_COMMUNITY)
Admission: RE | Admit: 2016-04-13 | Discharge: 2016-04-13 | Disposition: A | Discharge status: Home / Self Care | Payer: Medicaid Other | Source: Ambulatory Visit | Attending: Obstetrics and Gynecology | Admitting: Obstetrics and Gynecology

## 2016-04-13 ENCOUNTER — Other Ambulatory Visit (HOSPITAL_COMMUNITY)
Admission: RE | Admit: 2016-04-13 | Discharge: 2016-04-13 | Disposition: A | Discharge status: Home / Self Care | Payer: Medicaid Other | Source: Ambulatory Visit | Attending: Obstetrics and Gynecology | Admitting: Obstetrics and Gynecology

## 2016-04-13 ENCOUNTER — Other Ambulatory Visit: Payer: Self-pay | Admitting: Obstetrics and Gynecology

## 2016-04-13 VITALS — BP 128/77 | HR 80 | Wt 196.4 lb

## 2016-04-13 DIAGNOSIS — O35EXX0 Maternal care for other (suspected) fetal abnormality and damage, fetal genitourinary anomalies, not applicable or unspecified: Secondary | ICD-10-CM

## 2016-04-13 DIAGNOSIS — O99323 Drug use complicating pregnancy, third trimester: Secondary | ICD-10-CM | POA: Insufficient documentation

## 2016-04-13 DIAGNOSIS — O24419 Gestational diabetes mellitus in pregnancy, unspecified control: Secondary | ICD-10-CM

## 2016-04-13 DIAGNOSIS — E039 Hypothyroidism, unspecified: Secondary | ICD-10-CM | POA: Insufficient documentation

## 2016-04-13 DIAGNOSIS — O358XX0 Maternal care for other (suspected) fetal abnormality and damage, not applicable or unspecified: Secondary | ICD-10-CM

## 2016-04-13 DIAGNOSIS — Z113 Encounter for screening for infections with a predominantly sexual mode of transmission: Secondary | ICD-10-CM | POA: Diagnosis not present

## 2016-04-13 DIAGNOSIS — F1910 Other psychoactive substance abuse, uncomplicated: Secondary | ICD-10-CM | POA: Insufficient documentation

## 2016-04-13 DIAGNOSIS — Z3A35 35 weeks gestation of pregnancy: Secondary | ICD-10-CM | POA: Insufficient documentation

## 2016-04-13 DIAGNOSIS — Z362 Encounter for other antenatal screening follow-up: Secondary | ICD-10-CM | POA: Diagnosis not present

## 2016-04-13 DIAGNOSIS — F333 Major depressive disorder, recurrent, severe with psychotic symptoms: Secondary | ICD-10-CM

## 2016-04-13 DIAGNOSIS — O0990 Supervision of high risk pregnancy, unspecified, unspecified trimester: Secondary | ICD-10-CM

## 2016-04-13 DIAGNOSIS — O99343 Other mental disorders complicating pregnancy, third trimester: Secondary | ICD-10-CM

## 2016-04-13 DIAGNOSIS — O99283 Endocrine, nutritional and metabolic diseases complicating pregnancy, third trimester: Secondary | ICD-10-CM | POA: Insufficient documentation

## 2016-04-13 NOTE — Progress Notes (Signed)
   PRENATAL VISIT NOTE  Subjective:  Mary Ramirez is a 36 y.o. [id number] at 3225w3d being seen today for ongoing prenatal care.  She is currently monitored for the following issues for this high-risk pregnancy and has Severe recurrent major depressive disorder with psychotic features (HCC); Cannabis use disorder, moderate, dependence (HCC); Suicidal ideation; Tobacco use disorder; Hypothyroidism; PTSD (post-traumatic stress disorder); Developmental disability; Supervision of high risk pregnancy due to Social Issues; Trichomonal vaginitis during pregnancy; Hypothyroidism affecting pregnancy, antepartum; failed 1hr GCT; Homelessness; LGSIL on Pap smear of cervix; Encounter for repeat ultrasound of fetal pyelectasis, antepartum; and Gestational diabetes mellitus (GDM) affecting pregnancy on her problem list.  Patient reports no complaints.  Contractions: Not present. Vag. Bleeding: None.  Movement: Present. Denies leaking of fluid.   The following portions of the patient's history were reviewed and updated as appropriate: allergies, current medications, past family history, past medical history, past social history, past surgical history and problem list. Problem list updated.  Objective:   Vitals:   04/13/16 1129  BP: 128/77  Pulse: 80  Weight: 196 lb 6.4 oz (89.1 kg)    Fetal Status: Fetal Heart Rate (bpm): 147   Movement: Present     General:  Alert, oriented and cooperative. Patient is in no acute distress.  Skin: Skin is warm and dry. No rash noted.   Cardiovascular: Normal heart rate noted  Respiratory: Normal respiratory effort, no problems with respiration noted  Abdomen: Soft, gravid, appropriate for gestational age. Pain/Pressure: Absent     Pelvic:  Cervical exam performed        Extremities: Normal range of motion.  Edema: None  Mental Status: Normal mood and affect. Normal behavior. Normal judgment and thought content.   Assessment and Plan:  Pregnancy: [id number] at  4225w3d  1. Gestational diabetes mellitus (GDM) affecting pregnancy To see diabetes education to start testing in 4 days  2. Supervision of high risk pregnancy due to Social Issues States she will try to keep appointments and get her synthroid  3. Severe recurrent major depressive disorder with psychotic features (HCC)  Flat affect today Preterm labor symptoms and general obstetric precautions including but not limited to vaginal bleeding, contractions, leaking of fluid and fetal movement were reviewed in detail with the patient. Please refer to After Visit Summary for other counseling recommendations.  Return in about 4 days (around 04/17/2016) for diabetes .   Adam PhenixJames G Chrishelle Zito, MD

## 2016-04-15 LAB — CERVICOVAGINAL ANCILLARY ONLY
Chlamydia: NEGATIVE
NEISSERIA GONORRHEA: NEGATIVE

## 2016-04-15 LAB — STREP GP B NAA: Strep Gp B NAA: POSITIVE — AB

## 2016-04-17 ENCOUNTER — Other Ambulatory Visit: Payer: Self-pay

## 2016-04-25 ENCOUNTER — Telehealth: Payer: Self-pay | Admitting: Obstetrics & Gynecology

## 2016-04-25 NOTE — Telephone Encounter (Signed)
Received a phone call from [facility] this morning concerning patient Mary Ramirez. Ms. Manson PasseyBrown stated Ms. Earlene PlaterWallace missed her last appointment. I was going to reschedule her diabetes appointment, but Ms. Manson PasseyBrown stated Ms. Earlene PlaterWallace did not want to talk to anyone, nor did she want to go to any classes. Ms. Earlene PlaterWallace feels because she is almost about to give birth, classes of any kind are not needed. I did however make an appointment for her OBF. She couldn't come in until April 5th.

## 2016-05-04 ENCOUNTER — Ambulatory Visit (INDEPENDENT_AMBULATORY_CARE_PROVIDER_SITE_OTHER): Payer: Medicaid Other | Admitting: Obstetrics and Gynecology

## 2016-05-04 ENCOUNTER — Ambulatory Visit (INDEPENDENT_AMBULATORY_CARE_PROVIDER_SITE_OTHER): Payer: Medicaid Other | Admitting: Clinical

## 2016-05-04 VITALS — BP 117/72 | HR 94 | Wt 195.8 lb

## 2016-05-04 DIAGNOSIS — E039 Hypothyroidism, unspecified: Secondary | ICD-10-CM | POA: Diagnosis not present

## 2016-05-04 DIAGNOSIS — O0990 Supervision of high risk pregnancy, unspecified, unspecified trimester: Secondary | ICD-10-CM

## 2016-05-04 DIAGNOSIS — O24419 Gestational diabetes mellitus in pregnancy, unspecified control: Secondary | ICD-10-CM

## 2016-05-04 DIAGNOSIS — Z5329 Procedure and treatment not carried out because of patient's decision for other reasons: Secondary | ICD-10-CM | POA: Insufficient documentation

## 2016-05-04 DIAGNOSIS — Z91199 Patient's noncompliance with other medical treatment and regimen due to unspecified reason: Secondary | ICD-10-CM | POA: Insufficient documentation

## 2016-05-04 DIAGNOSIS — A5901 Trichomonal vulvovaginitis: Secondary | ICD-10-CM | POA: Diagnosis not present

## 2016-05-04 DIAGNOSIS — O99280 Endocrine, nutritional and metabolic diseases complicating pregnancy, unspecified trimester: Secondary | ICD-10-CM

## 2016-05-04 DIAGNOSIS — Z5900 Homelessness unspecified: Secondary | ICD-10-CM

## 2016-05-04 DIAGNOSIS — O99283 Endocrine, nutritional and metabolic diseases complicating pregnancy, third trimester: Secondary | ICD-10-CM

## 2016-05-04 DIAGNOSIS — O23593 Infection of other part of genital tract in pregnancy, third trimester: Secondary | ICD-10-CM

## 2016-05-04 DIAGNOSIS — F4312 Post-traumatic stress disorder, chronic: Secondary | ICD-10-CM

## 2016-05-04 LAB — POCT URINALYSIS DIP (DEVICE)
Bilirubin Urine: NEGATIVE
GLUCOSE, UA: NEGATIVE mg/dL
Hgb urine dipstick: NEGATIVE
KETONES UR: NEGATIVE mg/dL
Nitrite: NEGATIVE
PROTEIN: 30 mg/dL — AB
Specific Gravity, Urine: 1.02 (ref 1.005–1.030)
UROBILINOGEN UA: 1 mg/dL (ref 0.0–1.0)
pH: 6 (ref 5.0–8.0)

## 2016-05-04 NOTE — Progress Notes (Signed)
Prenatal Visit Note Date: 05/04/2016 Clinic: [facility]  Subjective:  Mary Ramirez is a 36 y.o. [id number] at [redacted]w[redacted]d being seen today for ongoing prenatal care.  She is currently monitored for the following issues for this high-risk pregnancy and has Severe recurrent major depressive disorder with psychotic features (HCC); Cannabis use disorder, moderate, dependence (HCC); Suicidal ideation; Tobacco use disorder; Hypothyroidism; PTSD (post-traumatic stress disorder); Developmental disability; Supervision of high risk pregnancy due to Social Issues; Trichomonal vaginitis during pregnancy; Hypothyroidism affecting pregnancy, antepartum; failed 1hr GCT; Homelessness; LGSIL on Pap smear of cervix; Gestational diabetes mellitus (GDM) affecting pregnancy; Drug abuse; and Patient non-compliant on her problem list.  Patient reports no complaints.   Contractions: Not present. Vag. Bleeding: None.  Movement: Present. Denies leaking of fluid.   The following portions of the patient's history were reviewed and updated as appropriate: allergies, current medications, past family history, past medical history, past social history, past surgical history and problem list. Problem list updated.  Objective:   Vitals:   05/04/16 0802 05/04/16 0826  BP: (!) 134/102 117/72  Pulse: 94   Weight: 195 lb 12.8 oz (88.8 kg)     Fetal Status: Fetal Heart Rate (bpm): 129 Fundal Height: 38 cm Movement: Present  Presentation: Vertex  General:  Alert, oriented and cooperative. Patient is in no acute distress.  Skin: Skin is warm and dry. No rash noted.   Cardiovascular: Normal heart rate noted  Respiratory: Normal respiratory effort, no problems with respiration noted  Abdomen: Soft, gravid, appropriate for gestational age. Pain/Pressure: Absent     Pelvic:  Cervical exam performed Dilation: Fingertip Effacement (%): 50 Station: -1 per patient desire  Extremities: Normal range of motion.  Edema:  Trace  Mental Status: Normal mood and affect. Normal behavior. Normal judgment and thought content.   Urinalysis:      Assessment and Plan:  Pregnancy: [id number] at [redacted]w[redacted]d  1. Trichomonal vaginitis during pregnancy in third trimester TOC negative  2. Supervision of high risk pregnancy due to Social Issues Routine care. GBS neg. Seeing Hulda Marin SW today. Patient states that she continues to live on the streets. BTL papers signed 2/5.   3. Hypothyroidism affecting pregnancy, antepartum Patient states that she can't afford the $3 for the synthroid. She does state that she is still smoking and when I ask her where she gets the money for this, she states that she "buys them." I told her the risks associated with non compliance (see below)  4. Gestational diabetes mellitus (GDM) affecting pregnancy Patient failed her 1hr at 32 on 2/13 but has never taken her 3hr. She cancelled her 2/20 appt and no showed to multiple appointments and was supposed to see DM on 3/19 but no showed to that appointment. I told her that based on her multiple issues (GDM and hypothyroidism with non compliance) that I recommended 39/0 IOL given risk of maternal and fetal risks, such as IUFD, pre-eclampsia, etc d/w her. She is adamant that she doesn't want this and states that the baby is fine. I offered her nst/afi today but she declines this as well. Pt told to let us know if she changes her mind.   Term labor symptoms and general obstetric precautions including but not limited to vaginal bleeding, contractions, leaking of fluid and fetal movement were reviewed in detail with the patient. Please refer to After Visit Summary for other counseling recommendations.  Return in about 4 days (around 05/08/2016) for rob.   [place] Bing, MD

## 2016-05-04 NOTE — BH Specialist Note (Signed)
Integrated Behavioral Health Follow Up Visit  MRN: [id number] Name: Mary Ramirez   Session Start time: 8:30 Session End time: 8:50 Total time: 20 minutes Number of Integrated Behavioral Health Clinician visits: 4/10  Type of Service: Integrated Behavioral Health- Individual/Family Interpretor:No. Interpretor Name and Language: n/a   Warm Hand Off Completed.       SUBJECTIVE: Amela Handley is a 36 y.o. female accompanied by patient. Patient was referred by Dr Vergie Living for psychosocial, emotion. Patient reports the following symptoms/concerns: Pt states that her primary concern today is that it was upsetting to hear that she may need to be induced; this brought up feelings from previous births, along with previous losses, and it helps to talk aloud her feelings today.Pt also concerned about lack of housing, and looks forward to giving birth to son "when he is ready to come", although she would agree to being induced if she felt his life were in danger. Duration of problem: Today; Severity of problem: mild  OBJECTIVE: Mood: Irritable and Affect: Appropriate Risk of harm to self or others: No plan to harm self or others   LIFE CONTEXT: Family and Social: Homeless(outside), working with case Programmer, applications on housing School/Work: - Self-Care: Walks for hours daily Life Changes: Current pregnancy  GOALS ADDRESSED: Patient will reduce symptoms of: agitation   INTERVENTIONS: Supportive Counseling Standardized Assessments completed: GAD-7 and PHQ 9  ASSESSMENT: Patient currently experiencing Chronic post-traumatic stress disorder and Homelessness. Patient may benefit from supportive counseling today.  PLAN: 1. Follow up with behavioral health clinician on : As needed 2. Behavioral recommendations:  -Continue daily walks until baby is born, as long as it remains helpful -Continue self-advocacy -Continue working with case Production designer, theatre/television/film on housing  3. Referral(s): Integrated Behavioral  Health Services (In Clinic) 4. "From scale of 1-10, how likely are you to follow plan?": 10  Rae Lips, LCSWA  Depression screen [facility] 2/9 05/04/2016 03/07/2016 03/07/2016 01/13/2016  Decreased Interest 0 0 2 1  Down, Depressed, Hopeless 0 0 2 0  PHQ - 2 Score 0 0 4 1  Altered sleeping 0 0 3 0  Tired, decreased energy 0  Change in appetite 0 0 3 0  Feeling bad or failure about yourself  0 0 0 0  Trouble concentrating 0 3 0 1  Moving slowly or fidgety/restless 0 0 0 0  Suicidal thoughts 0 0 0 2  PHQ-9 Score GAD 7 : Generalized Anxiety Score 05/04/2016 03/07/2016 03/07/2016 01/13/2016  Nervous, Anxious, on Edge 0 0 0 0  Control/stop worrying 0 0 3 1  Worry too much - different things 0 2 3 0  Trouble relaxing 0 Restless 0 0 3 1  Easily annoyed or irritable 0 Afraid - awful might happen 0 0 1 3  Total GAD 7 Score 0 7 16 11

## 2016-05-08 ENCOUNTER — Ambulatory Visit (INDEPENDENT_AMBULATORY_CARE_PROVIDER_SITE_OTHER): Payer: Medicaid Other | Admitting: Family Medicine

## 2016-05-08 VITALS — BP 138/77 | HR 92 | Wt 201.0 lb

## 2016-05-08 DIAGNOSIS — O0990 Supervision of high risk pregnancy, unspecified, unspecified trimester: Secondary | ICD-10-CM

## 2016-05-08 DIAGNOSIS — O0993 Supervision of high risk pregnancy, unspecified, third trimester: Secondary | ICD-10-CM

## 2016-05-08 DIAGNOSIS — O99283 Endocrine, nutritional and metabolic diseases complicating pregnancy, third trimester: Secondary | ICD-10-CM

## 2016-05-08 DIAGNOSIS — O99280 Endocrine, nutritional and metabolic diseases complicating pregnancy, unspecified trimester: Principal | ICD-10-CM

## 2016-05-08 DIAGNOSIS — E039 Hypothyroidism, unspecified: Secondary | ICD-10-CM

## 2016-05-08 DIAGNOSIS — O24419 Gestational diabetes mellitus in pregnancy, unspecified control: Secondary | ICD-10-CM

## 2016-05-08 NOTE — Patient Instructions (Signed)
 Third Trimester of Pregnancy The third trimester is from week 28 through week 40 (months 7 through 9). The third trimester is a time when the unborn baby (fetus) is growing rapidly. At the end of the ninth month, the fetus is about 20 inches in length and weighs 6-10 pounds. Body changes during your third trimester Your body will continue to go through many changes during pregnancy. The changes vary from woman to woman. During the third trimester:  Your weight will continue to increase. You can expect to gain 25-35 pounds (11-16 kg) by the end of the pregnancy.  You may begin to get stretch marks on your hips, abdomen, and breasts.  You may urinate more often because the fetus is moving lower into your pelvis and pressing on your bladder.  You may develop or continue to have heartburn. This is caused by increased hormones that slow down muscles in the digestive tract.  You may develop or continue to have constipation because increased hormones slow digestion and cause the muscles that push waste through your intestines to relax.  You may develop hemorrhoids. These are swollen veins (varicose veins) in the rectum that can itch or be painful.  You may develop swollen, bulging veins (varicose veins) in your legs.  You may have increased body aches in the pelvis, back, or thighs. This is due to weight gain and increased hormones that are relaxing your joints.  You may have changes in your hair. These can include thickening of your hair, rapid growth, and changes in texture. Some women also have hair loss during or after pregnancy, or hair that feels dry or thin. Your hair will most likely return to normal after your baby is born.  Your breasts will continue to grow and they will continue to become tender. A yellow fluid (colostrum) may leak from your breasts. This is the first milk you are producing for your baby.  Your belly button may stick out.  You may notice more swelling in your  hands, face, or ankles.  You may have increased tingling or numbness in your hands, arms, and legs. The skin on your belly may also feel numb.  You may feel short of breath because of your expanding uterus.  You may have more problems sleeping. This can be caused by the size of your belly, increased need to urinate, and an increase in your body's metabolism.  You may notice the fetus "dropping," or moving lower in your abdomen (lightening).  You may have increased vaginal discharge.  You may notice your joints feel loose and you may have pain around your pelvic bone.  What to expect at prenatal visits You will have prenatal exams every 2 weeks until week 36. Then you will have weekly prenatal exams. During a routine prenatal visit:  You will be weighed to make sure you and the baby are growing normally.  Your blood pressure will be taken.  Your abdomen will be measured to track your baby's growth.  The fetal heartbeat will be listened to.  Any test results from the previous visit will be discussed.  You may have a cervical check near your due date to see if your cervix has softened or thinned (effaced).  You will be tested for Group B streptococcus. This happens between 35 and 37 weeks.  Your health care provider may ask you:  What your birth plan is.  How you are feeling.  If you are feeling the baby move.  If you have   had any abnormal symptoms, such as leaking fluid, bleeding, severe headaches, or abdominal cramping.  If you are using any tobacco products, including cigarettes, chewing tobacco, and electronic cigarettes.  If you have any questions.  Other tests or screenings that may be performed during your third trimester include:  Blood tests that check for low iron levels (anemia).  Fetal testing to check the health, activity level, and growth of the fetus. Testing is done if you have certain medical conditions or if there are problems during the  pregnancy.  Nonstress test (NST). This test checks the health of your baby to make sure there are no signs of problems, such as the baby not getting enough oxygen. During this test, a belt is placed around your belly. The baby is made to move, and its heart rate is monitored during movement.  What is false labor? False labor is a condition in which you feel small, irregular tightenings of the muscles in the womb (contractions) that usually go away with rest, changing position, or drinking water. These are called Braxton Hicks contractions. Contractions may last for hours, days, or even weeks before true labor sets in. If contractions come at regular intervals, become more frequent, increase in intensity, or become painful, you should see your health care provider. What are the signs of labor?  Abdominal cramps.  Regular contractions that start at 10 minutes apart and become stronger and more frequent with time.  Contractions that start on the top of the uterus and spread down to the lower abdomen and back.  Increased pelvic pressure and dull back pain.  A watery or bloody mucus discharge that comes from the vagina.  Leaking of amniotic fluid. This is also known as your "water breaking." It could be a slow trickle or a gush. Let your health care provider know if it has a color or strange odor. If you have any of these signs, call your health care provider right away, even if it is before your due date. Follow these instructions at home: Medicines  Follow your health care provider's instructions regarding medicine use. Specific medicines may be either safe or unsafe to take during pregnancy.  Take a prenatal vitamin that contains at least 600 micrograms (mcg) of folic acid.  If you develop constipation, try taking a stool softener if your health care provider approves. Eating and drinking  Eat a balanced diet that includes fresh fruits and vegetables, whole grains, good sources of protein  such as meat, eggs, or tofu, and low-fat dairy. Your health care provider will help you determine the amount of weight gain that is right for you.  Avoid raw meat and uncooked cheese. These carry germs that can cause birth defects in the baby.  If you have low calcium intake from food, talk to your health care provider about whether you should take a daily calcium supplement.  Eat four or five small meals rather than three large meals a day.  Limit foods that are high in fat and processed sugars, such as fried and sweet foods.  To prevent constipation: ? Drink enough fluid to keep your urine clear or pale yellow. ? Eat foods that are high in fiber, such as fresh fruits and vegetables, whole grains, and beans. Activity  Exercise only as directed by your health care provider. Most women can continue their usual exercise routine during pregnancy. Try to exercise for 30 minutes at least 5 days a week. Stop exercising if you experience uterine contractions.  Avoid   heavy lifting.  Do not exercise in extreme heat or humidity, or at high altitudes.  Wear low-heel, comfortable shoes.  Practice good posture.  You may continue to have sex unless your health care provider tells you otherwise. Relieving pain and discomfort  Take frequent breaks and rest with your legs elevated if you have leg cramps or low back pain.  Take warm sitz baths to soothe any pain or discomfort caused by hemorrhoids. Use hemorrhoid cream if your health care provider approves.  Wear a good support bra to prevent discomfort from breast tenderness.  If you develop varicose veins: ? Wear support pantyhose or compression stockings as told by your healthcare provider. ? Elevate your feet for 15 minutes, 3-4 times a day. Prenatal care  Write down your questions. Take them to your prenatal visits.  Keep all your prenatal visits as told by your health care provider. This is important. Safety  Wear your seat belt at  all times when driving.  Make a list of emergency phone numbers, including numbers for family, friends, the hospital, and police and fire departments. General instructions  Avoid cat litter boxes and soil used by cats. These carry germs that can cause birth defects in the baby. If you have a cat, ask someone to clean the litter box for you.  Do not travel far distances unless it is absolutely necessary and only with the approval of your health care provider.  Do not use hot tubs, steam rooms, or saunas.  Do not drink alcohol.  Do not use any products that contain nicotine or tobacco, such as cigarettes and e-cigarettes. If you need help quitting, ask your health care provider.  Do not use any medicinal herbs or unprescribed drugs. These chemicals affect the formation and growth of the baby.  Do not douche or use tampons or scented sanitary pads.  Do not cross your legs for long periods of time.  To prepare for the arrival of your baby: ? Take prenatal classes to understand, practice, and ask questions about labor and delivery. ? Make a trial run to the hospital. ? Visit the hospital and tour the maternity area. ? Arrange for maternity or paternity leave through employers. ? Arrange for family and friends to take care of pets while you are in the hospital. ? Purchase a rear-facing car seat and make sure you know how to install it in your car. ? Pack your hospital bag. ? Prepare the baby's nursery. Make sure to remove all pillows and stuffed animals from the baby's crib to prevent suffocation.  Visit your dentist if you have not gone during your pregnancy. Use a soft toothbrush to brush your teeth and be gentle when you floss. Contact a health care provider if:  You are unsure if you are in labor or if your water has broken.  You become dizzy.  You have mild pelvic cramps, pelvic pressure, or nagging pain in your abdominal area.  You have lower back pain.  You have persistent  nausea, vomiting, or diarrhea.  You have an unusual or bad smelling vaginal discharge.  You have pain when you urinate. Get help right away if:  Your water breaks before 37 weeks.  You have regular contractions less than 5 minutes apart before 37 weeks.  You have a fever.  You are leaking fluid from your vagina.  You have spotting or bleeding from your vagina.  You have severe abdominal pain or cramping.  You have rapid weight loss or weight   gain.  You have shortness of breath with chest pain.  You notice sudden or extreme swelling of your face, hands, ankles, feet, or legs.  Your baby makes fewer than 10 movements in 2 hours.  You have severe headaches that do not go away when you take medicine.  You have vision changes. Summary  The third trimester is from week 28 through week 40, months 7 through 9. The third trimester is a time when the unborn baby (fetus) is growing rapidly.  During the third trimester, your discomfort may increase as you and your baby continue to gain weight. You may have abdominal, leg, and back pain, sleeping problems, and an increased need to urinate.  During the third trimester your breasts will keep growing and they will continue to become tender. A yellow fluid (colostrum) may leak from your breasts. This is the first milk you are producing for your baby.  False labor is a condition in which you feel small, irregular tightenings of the muscles in the womb (contractions) that eventually go away. These are called Braxton Hicks contractions. Contractions may last for hours, days, or even weeks before true labor sets in.  Signs of labor can include: abdominal cramps; regular contractions that start at 10 minutes apart and become stronger and more frequent with time; watery or bloody mucus discharge that comes from the vagina; increased pelvic pressure and dull back pain; and leaking of amniotic fluid. This information is not intended to replace advice  given to you by your health care provider. Make sure you discuss any questions you have with your health care provider. Document Released: 01/10/2001 Document Revised: 06/24/2015 Document Reviewed: 03/19/2012 [employer] Interactive Patient Education  2017 [employer]   Breastfeeding Deciding to breastfeed is one of the best choices you can make for you and your baby. A change in hormones during pregnancy causes your breast tissue to grow and increases the number and size of your milk ducts. These hormones also allow proteins, sugars, and fats from your blood supply to make breast milk in your milk-producing glands. Hormones prevent breast milk from being released before your baby is born as well as prompt milk flow after birth. Once breastfeeding has begun, thoughts of your baby, as well as his or her sucking or crying, can stimulate the release of milk from your milk-producing glands. Benefits of breastfeeding For Your Baby  Your first milk (colostrum) helps your baby's digestive system function better.  There are antibodies in your milk that help your baby fight off infections.  Your baby has a lower incidence of asthma, allergies, and sudden infant death syndrome.  The nutrients in breast milk are better for your baby than infant formulas and are designed uniquely for your baby's needs.  Breast milk improves your baby's brain development.  Your baby is less likely to develop other conditions, such as childhood obesity, asthma, or type 2 diabetes mellitus.  For You  Breastfeeding helps to create a very special bond between you and your baby.  Breastfeeding is convenient. Breast milk is always available at the correct temperature and costs nothing.  Breastfeeding helps to burn calories and helps you lose the weight gained during pregnancy.  Breastfeeding makes your uterus contract to its prepregnancy size faster and slows bleeding (lochia) after you give birth.  Breastfeeding helps  to lower your risk of developing type 2 diabetes mellitus, osteoporosis, and breast or ovarian cancer later in life.  Signs that your baby is hungry Early Signs of Hunger    Increased alertness or activity.  Stretching.  Movement of the head from side to side.  Movement of the head and opening of the mouth when the corner of the mouth or cheek is stroked (rooting).  Increased sucking sounds, smacking lips, cooing, sighing, or squeaking.  Hand-to-mouth movements.  Increased sucking of fingers or hands.  Late Signs of Hunger  Fussing.  Intermittent crying.  Extreme Signs of Hunger Signs of extreme hunger will require calming and consoling before your baby will be able to breastfeed successfully. Do not wait for the following signs of extreme hunger to occur before you initiate breastfeeding:  Restlessness.  A loud, strong cry.  Screaming.  Breastfeeding basics Breastfeeding Initiation  Find a comfortable place to sit or lie down, with your neck and back well supported.  Place a pillow or rolled up blanket under your baby to bring him or her to the level of your breast (if you are seated). Nursing pillows are specially designed to help support your arms and your baby while you breastfeed.  Make sure that your baby's abdomen is facing your abdomen.  Gently massage your breast. With your fingertips, massage from your chest wall toward your nipple in a circular motion. This encourages milk flow. You may need to continue this action during the feeding if your milk flows slowly.  Support your breast with 4 fingers underneath and your thumb above your nipple. Make sure your fingers are well away from your nipple and your baby's mouth.  Stroke your baby's lips gently with your finger or nipple.  When your baby's mouth is open wide enough, quickly bring your baby to your breast, placing your entire nipple and as much of the colored area around your nipple (areola) as possible into  your baby's mouth. ? More areola should be visible above your baby's upper lip than below the lower lip. ? Your baby's tongue should be between his or her lower gum and your breast.  Ensure that your baby's mouth is correctly positioned around your nipple (latched). Your baby's lips should create a seal on your breast and be turned out (everted).  It is common for your baby to suck about 2-3 minutes in order to start the flow of breast milk.  Latching Teaching your baby how to latch on to your breast properly is very important. An improper latch can cause nipple pain and decreased milk supply for you and poor weight gain in your baby. Also, if your baby is not latched onto your nipple properly, he or she may swallow some air during feeding. This can make your baby fussy. Burping your baby when you switch breasts during the feeding can help to get rid of the air. However, teaching your baby to latch on properly is still the best way to prevent fussiness from swallowing air while breastfeeding. Signs that your baby has successfully latched on to your nipple:  Silent tugging or silent sucking, without causing you pain.  Swallowing heard between every 3-4 sucks.  Muscle movement above and in front of his or her ears while sucking.  Signs that your baby has not successfully latched on to nipple:  Sucking sounds or smacking sounds from your baby while breastfeeding.  Nipple pain.  If you think your baby has not latched on correctly, slip your finger into the corner of your baby's mouth to break the suction and place it between your baby's gums. Attempt breastfeeding initiation again. Signs of Successful Breastfeeding Signs from your baby:  A   gradual decrease in the number of sucks or complete cessation of sucking.  Falling asleep.  Relaxation of his or her body.  Retention of a small amount of milk in his or her mouth.  Letting go of your breast by himself or herself.  Signs from  you:  Breasts that have increased in firmness, weight, and size 1-3 hours after feeding.  Breasts that are softer immediately after breastfeeding.  Increased milk volume, as well as a change in milk consistency and color by the fifth day of breastfeeding.  Nipples that are not sore, cracked, or bleeding.  Signs That Your Baby is Getting Enough Milk  Wetting at least 1-2 diapers during the first 24 hours after birth.  Wetting at least 5-6 diapers every 24 hours for the first week after birth. The urine should be clear or pale yellow by 5 days after birth.  Wetting 6-8 diapers every 24 hours as your baby continues to grow and develop.  At least 3 stools in a 24-hour period by age 5 days. The stool should be soft and yellow.  At least 3 stools in a 24-hour period by age 7 days. The stool should be seedy and yellow.  No loss of weight greater than 10% of birth weight during the first 3 days of age.  Average weight gain of 4-7 ounces (113-198 g) per week after age 4 days.  Consistent daily weight gain by age 5 days, without weight loss after the age of 2 weeks.  After a feeding, your baby may spit up a small amount. This is common. Breastfeeding frequency and duration Frequent feeding will help you make more milk and can prevent sore nipples and breast engorgement. Breastfeed when you feel the need to reduce the fullness of your breasts or when your baby shows signs of hunger. This is called "breastfeeding on demand." Avoid introducing a pacifier to your baby while you are working to establish breastfeeding (the first 4-6 weeks after your baby is born). After this time you may choose to use a pacifier. Research has shown that pacifier use during the first year of a baby's life decreases the risk of sudden infant death syndrome (SIDS). Allow your baby to feed on each breast as long as he or she wants. Breastfeed until your baby is finished feeding. When your baby unlatches or falls asleep  while feeding from the first breast, offer the second breast. Because newborns are often sleepy in the first few weeks of life, you may need to awaken your baby to get him or her to feed. Breastfeeding times will vary from baby to baby. However, the following rules can serve as a guide to help you ensure that your baby is properly fed:  Newborns (babies 4 weeks of age or younger) may breastfeed every 1-3 hours.  Newborns should not go longer than 3 hours during the day or 5 hours during the night without breastfeeding.  You should breastfeed your baby a minimum of 8 times in a 24-hour period until you begin to introduce solid foods to your baby at around 6 months of age.  Breast milk pumping Pumping and storing breast milk allows you to ensure that your baby is exclusively fed your breast milk, even at times when you are unable to breastfeed. This is especially important if you are going back to work while you are still breastfeeding or when you are not able to be present during feedings. Your lactation consultant can give you guidelines on how   long it is safe to store breast milk. A breast pump is a machine that allows you to pump milk from your breast into a sterile bottle. The pumped breast milk can then be stored in a refrigerator or freezer. Some breast pumps are operated by hand, while others use electricity. Ask your lactation consultant which type will work best for you. Breast pumps can be purchased, but some hospitals and breastfeeding support groups lease breast pumps on a monthly basis. A lactation consultant can teach you how to hand express breast milk, if you prefer not to use a pump. Caring for your breasts while you breastfeed Nipples can become dry, cracked, and sore while breastfeeding. The following recommendations can help keep your breasts moisturized and healthy:  Avoid using soap on your nipples.  Wear a supportive bra. Although not required, special nursing bras and tank  tops are designed to allow access to your breasts for breastfeeding without taking off your entire bra or top. Avoid wearing underwire-style bras or extremely tight bras.  Air dry your nipples for 3-4minutes after each feeding.  Use only cotton bra pads to absorb leaked breast milk. Leaking of breast milk between feedings is normal.  Use lanolin on your nipples after breastfeeding. Lanolin helps to maintain your skin's normal moisture barrier. If you use pure lanolin, you do not need to wash it off before feeding your baby again. Pure lanolin is not toxic to your baby. You may also hand express a few drops of breast milk and gently massage that milk into your nipples and allow the milk to air dry.  In the first few weeks after giving birth, some women experience extremely full breasts (engorgement). Engorgement can make your breasts feel heavy, warm, and tender to the touch. Engorgement peaks within 3-5 days after you give birth. The following recommendations can help ease engorgement:  Completely empty your breasts while breastfeeding or pumping. You may want to start by applying warm, moist heat (in the shower or with warm water-soaked hand towels) just before feeding or pumping. This increases circulation and helps the milk flow. If your baby does not completely empty your breasts while breastfeeding, pump any extra milk after he or she is finished.  Wear a snug bra (nursing or regular) or tank top for 1-2 days to signal your body to slightly decrease milk production.  Apply ice packs to your breasts, unless this is too uncomfortable for you.  Make sure that your baby is latched on and positioned properly while breastfeeding.  If engorgement persists after 48 hours of following these recommendations, contact your health care provider or a lactation consultant. Overall health care recommendations while breastfeeding  Eat healthy foods. Alternate between meals and snacks, eating 3 of each per  day. Because what you eat affects your breast milk, some of the foods may make your baby more irritable than usual. Avoid eating these foods if you are sure that they are negatively affecting your baby.  Drink milk, fruit juice, and water to satisfy your thirst (about 10 glasses a day).  Rest often, relax, and continue to take your prenatal vitamins to prevent fatigue, stress, and anemia.  Continue breast self-awareness checks.  Avoid chewing and smoking tobacco. Chemicals from cigarettes that pass into breast milk and exposure to secondhand smoke may harm your baby.  Avoid alcohol and drug use, including marijuana. Some medicines that may be harmful to your baby can pass through breast milk. It is important to ask your health care   provider before taking any medicine, including all over-the-counter and prescription medicine as well as vitamin and herbal supplements. It is possible to become pregnant while breastfeeding. If birth control is desired, ask your health care provider about options that will be safe for your baby. Contact a health care provider if:  You feel like you want to stop breastfeeding or have become frustrated with breastfeeding.  You have painful breasts or nipples.  Your nipples are cracked or bleeding.  Your breasts are red, tender, or warm.  You have a swollen area on either breast.  You have a fever or chills.  You have nausea or vomiting.  You have drainage other than breast milk from your nipples.  Your breasts do not become full before feedings by the fifth day after you give birth.  You feel sad and depressed.  Your baby is too sleepy to eat well.  Your baby is having trouble sleeping.  Your baby is wetting less than 3 diapers in a 24-hour period.  Your baby has less than 3 stools in a 24-hour period.  Your baby's skin or the white part of his or her eyes becomes yellow.  Your baby is not gaining weight by 5 days of age. Get help right away  if:  Your baby is overly tired (lethargic) and does not want to wake up and feed.  Your baby develops an unexplained fever. This information is not intended to replace advice given to you by your health care provider. Make sure you discuss any questions you have with your health care provider. Document Released: 01/16/2005 Document Revised: 06/30/2015 Document Reviewed: 07/10/2012 Elsevier Interactive Patient Education  2017 Elsevier Inc.  

## 2016-05-08 NOTE — Progress Notes (Signed)
Induction scheduled for 05/17/16 at 0730.

## 2016-05-08 NOTE — Progress Notes (Signed)
   PRENATAL VISIT NOTE  Subjective:  Mary Ramirez is a 36 y.o. [id number] at [redacted]w[redacted]d being seen today for ongoing prenatal care.  She is currently monitored for the following issues for this high-risk pregnancy and has Severe recurrent major depressive disorder with psychotic features (HCC); Cannabis use disorder, moderate, dependence (HCC); Suicidal ideation; Tobacco use disorder; Hypothyroidism; PTSD (post-traumatic stress disorder); Developmental disability; Supervision of high risk pregnancy due to Social Issues; Trichomonal vaginitis during pregnancy; Hypothyroidism affecting pregnancy, antepartum; failed 1hr GCT; Homelessness; LGSIL on Pap smear of cervix; Gestational diabetes mellitus (GDM) affecting pregnancy; Drug abuse; and Patient non-compliant on her problem list.  Patient reports contractions since last few days, increasing pelvic pressure.  Contractions: Not present. Vag. Bleeding: None.  Movement: Present. Denies leaking of fluid.   The following portions of the patient's history were reviewed and updated as appropriate: allergies, current medications, past family history, past medical history, past social history, past surgical history and problem list. Problem list updated.  Objective:   Vitals:   05/08/16 1049  BP: 138/77  Pulse: 92  Weight: 201 lb (91.2 kg)    Fetal Status: Fetal Heart Rate (bpm): 150 Fundal Height: 39 cm Movement: Present  Presentation: Vertex  General:  Alert, oriented and cooperative. Patient is in no acute distress.  Skin: Skin is warm and dry. No rash noted.   Cardiovascular: Normal heart rate noted  Respiratory: Normal respiratory effort, no problems with respiration noted  Abdomen: Soft, gravid, appropriate for gestational age. Pain/Pressure: Present     Pelvic:  Cervical exam performed Dilation: 2.5 Effacement (%): 100 Station: +1  Extremities: Normal range of motion.  Edema: Trace  Mental Status: Normal mood and affect. Normal behavior. Normal  judgment and thought content.  3/15 u/s vtx, nml AFI, EFW 2783 gms, 6lb 1 oz (68%) Assessment and Plan:  Pregnancy: [id number] at [redacted]w[redacted]d 1. Hypothyroidism affecting pregnancy, antepartum Not taking her synthroid, feels same either way. Last TSH was 6.3 on 2/19  2. Gestational diabetes mellitus (GDM) affecting pregnancy Not following diet, not checking sugars, upset that someone mentioned testing to her or IUFD possibility. Declines testing and IOL until 40+ wks.--scheduled next week On no meds.--growth ok  3. Supervision of high risk pregnancy due to Social Issues She reports cervix treated in past. Initial exam showed cervx to be 1cm/100/+1-2--some manual dilation and scar tissue broken up would need NST due to postdates next week   Term labor symptoms and general obstetric precautions including but not limited to vaginal bleeding, contractions, leaking of fluid and fetal movement were reviewed in detail with the patient. Please refer to After Visit Summary for other counseling recommendations.  Return in 1 week (on 05/15/2016).   Reva Bores, MD

## 2016-05-10 ENCOUNTER — Inpatient Hospital Stay (HOSPITAL_COMMUNITY)
Admission: AD | Admit: 2016-05-10 | Discharge: 2016-05-15 | DRG: 775 | Disposition: A | Discharge status: Home / Self Care | Payer: Medicaid Other | Source: Ambulatory Visit | Attending: Family Medicine | Admitting: Family Medicine

## 2016-05-10 ENCOUNTER — Inpatient Hospital Stay (HOSPITAL_COMMUNITY): Payer: Medicaid Other | Admitting: Anesthesiology

## 2016-05-10 ENCOUNTER — Encounter (HOSPITAL_COMMUNITY): Payer: Self-pay

## 2016-05-10 DIAGNOSIS — F1290 Cannabis use, unspecified, uncomplicated: Secondary | ICD-10-CM | POA: Diagnosis present

## 2016-05-10 DIAGNOSIS — O99324 Drug use complicating childbirth: Secondary | ICD-10-CM | POA: Diagnosis present

## 2016-05-10 DIAGNOSIS — O1413 Severe pre-eclampsia, third trimester: Secondary | ICD-10-CM | POA: Diagnosis present

## 2016-05-10 DIAGNOSIS — O99344 Other mental disorders complicating childbirth: Secondary | ICD-10-CM | POA: Diagnosis present

## 2016-05-10 DIAGNOSIS — O99284 Endocrine, nutritional and metabolic diseases complicating childbirth: Secondary | ICD-10-CM | POA: Diagnosis present

## 2016-05-10 DIAGNOSIS — E039 Hypothyroidism, unspecified: Secondary | ICD-10-CM | POA: Diagnosis present

## 2016-05-10 DIAGNOSIS — Z5900 Homelessness unspecified: Secondary | ICD-10-CM

## 2016-05-10 DIAGNOSIS — O99824 Streptococcus B carrier state complicating childbirth: Secondary | ICD-10-CM | POA: Diagnosis not present

## 2016-05-10 DIAGNOSIS — F17210 Nicotine dependence, cigarettes, uncomplicated: Secondary | ICD-10-CM | POA: Diagnosis present

## 2016-05-10 DIAGNOSIS — O2402 Pre-existing diabetes mellitus, type 1, in childbirth: Secondary | ICD-10-CM | POA: Diagnosis not present

## 2016-05-10 DIAGNOSIS — O24429 Gestational diabetes mellitus in childbirth, unspecified control: Secondary | ICD-10-CM | POA: Diagnosis present

## 2016-05-10 DIAGNOSIS — F4310 Post-traumatic stress disorder, unspecified: Secondary | ICD-10-CM | POA: Diagnosis present

## 2016-05-10 DIAGNOSIS — Z3A39 39 weeks gestation of pregnancy: Secondary | ICD-10-CM | POA: Diagnosis not present

## 2016-05-10 DIAGNOSIS — O99334 Smoking (tobacco) complicating childbirth: Secondary | ICD-10-CM | POA: Diagnosis present

## 2016-05-10 DIAGNOSIS — O1414 Severe pre-eclampsia complicating childbirth: Secondary | ICD-10-CM | POA: Diagnosis present

## 2016-05-10 DIAGNOSIS — F329 Major depressive disorder, single episode, unspecified: Secondary | ICD-10-CM | POA: Diagnosis present

## 2016-05-10 DIAGNOSIS — O99280 Endocrine, nutritional and metabolic diseases complicating pregnancy, unspecified trimester: Secondary | ICD-10-CM

## 2016-05-10 DIAGNOSIS — O99282 Endocrine, nutritional and metabolic diseases complicating pregnancy, second trimester: Secondary | ICD-10-CM

## 2016-05-10 DIAGNOSIS — E109 Type 1 diabetes mellitus without complications: Secondary | ICD-10-CM | POA: Diagnosis not present

## 2016-05-10 HISTORY — DX: Other psychoactive substance abuse, uncomplicated: F19.10

## 2016-05-10 HISTORY — DX: Postpartum depression: F53.0

## 2016-05-10 HISTORY — DX: Cannabis use, unspecified, uncomplicated: F12.90

## 2016-05-10 HISTORY — DX: Other specified personal risk factors, not elsewhere classified: Z91.89

## 2016-05-10 HISTORY — DX: Severe pre-eclampsia, third trimester: O14.13

## 2016-05-10 HISTORY — DX: Tobacco use: Z72.0

## 2016-05-10 HISTORY — DX: Other mental disorders complicating the puerperium: O99.345

## 2016-05-10 HISTORY — DX: Personal history of physical and sexual abuse in childhood: Z62.810

## 2016-05-10 HISTORY — DX: Homelessness: Z59.0

## 2016-05-10 HISTORY — DX: Homelessness unspecified: Z59.00

## 2016-05-10 LAB — COMPREHENSIVE METABOLIC PANEL
ALT: 21 U/L (ref 14–54)
ANION GAP: 9 (ref 5–15)
AST: 25 U/L (ref 15–41)
Albumin: 2.7 g/dL — ABNORMAL LOW (ref 3.5–5.0)
Alkaline Phosphatase: 258 U/L — ABNORMAL HIGH (ref 38–126)
BUN: 15 mg/dL (ref 6–20)
CHLORIDE: 102 mmol/L (ref 101–111)
CO2: 23 mmol/L (ref 22–32)
CREATININE: 0.63 mg/dL (ref 0.44–1.00)
Calcium: 8.7 mg/dL — ABNORMAL LOW (ref 8.9–10.3)
Glucose, Bld: 146 mg/dL — ABNORMAL HIGH (ref 65–99)
POTASSIUM: 3.9 mmol/L (ref 3.5–5.1)
SODIUM: 134 mmol/L — AB (ref 135–145)
Total Bilirubin: 0.6 mg/dL (ref 0.3–1.2)
Total Protein: 7.1 g/dL (ref 6.5–8.1)

## 2016-05-10 LAB — CBC
HEMATOCRIT: 34.8 % — AB (ref 36.0–46.0)
HEMOGLOBIN: 11 g/dL — AB (ref 12.0–15.0)
MCH: 25.2 pg — AB (ref 26.0–34.0)
MCHC: 31.6 g/dL (ref 30.0–36.0)
MCV: 79.6 fL (ref 78.0–100.0)
Platelets: 326 10*3/uL (ref 150–400)
RBC: 4.37 MIL/uL (ref 3.87–5.11)
RDW: 13.9 % (ref 11.5–15.5)
WBC: 13.1 10*3/uL — ABNORMAL HIGH (ref 4.0–10.5)

## 2016-05-10 LAB — ETHANOL: Alcohol, Ethyl (B): <5 mg/dL (ref <5)

## 2016-05-10 LAB — PROTEIN / CREATININE RATIO, URINE
CREATININE, URINE: 83 mg/dL
PROTEIN CREATININE RATIO: 0.34 mg/mg{creat} — AB (ref 0.00–0.15)
TOTAL PROTEIN, URINE: 28 mg/dL

## 2016-05-10 LAB — RAPID URINE DRUG SCREEN, HOSP PERFORMED
Amphetamines: NOT DETECTED
Barbiturates: NOT DETECTED
Benzodiazepines: NOT DETECTED
COCAINE: NOT DETECTED
OPIATES: NOT DETECTED
TETRAHYDROCANNABINOL: NOT DETECTED

## 2016-05-10 LAB — ABO/RH: ABO/RH(D): B POS

## 2016-05-10 LAB — HEPATITIS B SURFACE ANTIGEN: HEP B S AG: NEGATIVE

## 2016-05-10 LAB — TYPE AND SCREEN
ABO/RH(D): B POS
ANTIBODY SCREEN: NEGATIVE

## 2016-05-10 LAB — GLUCOSE, CAPILLARY: GLUCOSE-CAPILLARY: 160 mg/dL — AB (ref 65–99)

## 2016-05-10 MED ORDER — TETANUS-DIPHTH-ACELL PERTUSSIS 5-2.5-18.5 LF-MCG/0.5 IM SUSP: 0.5000 mL | Freq: Once | INTRAMUSCULAR | Status: DC

## 2016-05-10 MED ORDER — LACTATED RINGERS IV SOLN
500.0000 mL | Freq: Once | INTRAVENOUS | Status: AC
  Administered 2016-05-10: 500 mL via INTRAVENOUS

## 2016-05-10 MED ORDER — LEVOTHYROXINE SODIUM 125 MCG PO TABS
125.0000 ug | ORAL_TABLET | Freq: Every day | ORAL | Status: DC
  Administered 2016-05-10 – 2016-05-15 (×6): 125 ug via ORAL
  Filled 2016-05-10 (×7): qty 1

## 2016-05-10 MED ORDER — SODIUM CHLORIDE 0.9% FLUSH
3.0000 mL | Freq: Two times a day (BID) | INTRAVENOUS | Status: DC
  Administered 2016-05-13: 3 mL via INTRAVENOUS

## 2016-05-10 MED ORDER — ONDANSETRON HCL 4 MG/2ML IJ SOLN: 4.0000 mg | INTRAMUSCULAR | Status: DC | PRN

## 2016-05-10 MED ORDER — SOD CITRATE-CITRIC ACID 500-334 MG/5ML PO SOLN: 30.0000 mL | ORAL | Status: DC | PRN

## 2016-05-10 MED ORDER — WITCH HAZEL-GLYCERIN EX PADS: 1.0000 "application " | MEDICATED_PAD | CUTANEOUS | Status: DC | PRN

## 2016-05-10 MED ORDER — LIDOCAINE HCL (PF) 1 % IJ SOLN
30.0000 mL | INTRAMUSCULAR | Status: DC | PRN
  Filled 2016-05-10: qty 30

## 2016-05-10 MED ORDER — MAGNESIUM SULFATE 40 G IN LACTATED RINGERS - SIMPLE
2.0000 g/h | INTRAVENOUS | Status: DC
  Administered 2016-05-10: 2 g/h via INTRAVENOUS
  Filled 2016-05-10: qty 500

## 2016-05-10 MED ORDER — FENTANYL 2.5 MCG/ML BUPIVACAINE 1/10 % EPIDURAL INFUSION (WH - ANES)
14.0000 mL/h | INTRAMUSCULAR | Status: DC | PRN
  Administered 2016-05-10: 14 mL/h via EPIDURAL
  Filled 2016-05-10: qty 100

## 2016-05-10 MED ORDER — SENNOSIDES-DOCUSATE SODIUM 8.6-50 MG PO TABS
2.0000 | ORAL_TABLET | ORAL | Status: DC
  Administered 2016-05-11 – 2016-05-14 (×5): 2 via ORAL
  Filled 2016-05-10 (×5): qty 2

## 2016-05-10 MED ORDER — LACTATED RINGERS IV SOLN: 500.0000 mL | INTRAVENOUS | Status: DC | PRN

## 2016-05-10 MED ORDER — ACETAMINOPHEN 325 MG PO TABS: 650.0000 mg | ORAL_TABLET | ORAL | Status: DC | PRN

## 2016-05-10 MED ORDER — MAGNESIUM SULFATE 40 G IN LACTATED RINGERS - SIMPLE
2.0000 g/h | INTRAVENOUS | Status: AC
  Administered 2016-05-10: 2 g/h via INTRAVENOUS
  Filled 2016-05-10: qty 40
  Filled 2016-05-10: qty 500

## 2016-05-10 MED ORDER — ACETAMINOPHEN 325 MG PO TABS
650.0000 mg | ORAL_TABLET | ORAL | Status: DC | PRN
  Administered 2016-05-10 – 2016-05-11 (×3): 650 mg via ORAL
  Filled 2016-05-10 (×3): qty 2

## 2016-05-10 MED ORDER — EPHEDRINE 5 MG/ML INJ
10.0000 mg | INTRAVENOUS | Status: DC | PRN
  Filled 2016-05-10: qty 2

## 2016-05-10 MED ORDER — DIBUCAINE 1 % RE OINT: 1.0000 "application " | TOPICAL_OINTMENT | RECTAL | Status: DC | PRN

## 2016-05-10 MED ORDER — PRENATAL MULTIVITAMIN CH
1.0000 | ORAL_TABLET | Freq: Every day | ORAL | Status: DC
  Administered 2016-05-10 – 2016-05-14 (×5): 1 via ORAL
  Filled 2016-05-10 (×5): qty 1

## 2016-05-10 MED ORDER — LABETALOL HCL 5 MG/ML IV SOLN
INTRAVENOUS | Status: AC
  Administered 2016-05-10: 20 mg via INTRAVENOUS
  Filled 2016-05-10: qty 4

## 2016-05-10 MED ORDER — HYDRALAZINE HCL 20 MG/ML IJ SOLN: 10.0000 mg | Freq: Once | INTRAMUSCULAR | Status: DC | PRN

## 2016-05-10 MED ORDER — PHENYLEPHRINE 40 MCG/ML (10ML) SYRINGE FOR IV PUSH (FOR BLOOD PRESSURE SUPPORT)
80.0000 ug | PREFILLED_SYRINGE | INTRAVENOUS | Status: DC | PRN
  Filled 2016-05-10: qty 5

## 2016-05-10 MED ORDER — FLEET ENEMA 7-19 GM/118ML RE ENEM: 1.0000 | ENEMA | RECTAL | Status: DC | PRN

## 2016-05-10 MED ORDER — MAGNESIUM SULFATE BOLUS VIA INFUSION
4.0000 g | Freq: Once | INTRAVENOUS | Status: AC
  Administered 2016-05-10: 4 g via INTRAVENOUS
  Filled 2016-05-10: qty 500

## 2016-05-10 MED ORDER — SIMETHICONE 80 MG PO CHEW: 80.0000 mg | CHEWABLE_TABLET | ORAL | Status: DC | PRN

## 2016-05-10 MED ORDER — ONDANSETRON HCL 4 MG PO TABS: 4.0000 mg | ORAL_TABLET | ORAL | Status: DC | PRN

## 2016-05-10 MED ORDER — OXYTOCIN BOLUS FROM INFUSION
500.0000 mL | Freq: Once | INTRAVENOUS | Status: AC
  Administered 2016-05-10: 500 mL via INTRAVENOUS

## 2016-05-10 MED ORDER — DIPHENHYDRAMINE HCL 50 MG/ML IJ SOLN: 12.5000 mg | INTRAMUSCULAR | Status: DC | PRN

## 2016-05-10 MED ORDER — ZOLPIDEM TARTRATE 5 MG PO TABS: 5.0000 mg | ORAL_TABLET | Freq: Every evening | ORAL | Status: DC | PRN

## 2016-05-10 MED ORDER — FENTANYL CITRATE (PF) 100 MCG/2ML IJ SOLN
50.0000 ug | INTRAMUSCULAR | Status: DC | PRN
  Administered 2016-05-10: 100 ug via INTRAVENOUS
  Filled 2016-05-10: qty 2

## 2016-05-10 MED ORDER — OXYCODONE-ACETAMINOPHEN 5-325 MG PO TABS: 1.0000 | ORAL_TABLET | ORAL | Status: DC | PRN

## 2016-05-10 MED ORDER — ONDANSETRON HCL 4 MG/2ML IJ SOLN: 4.0000 mg | Freq: Four times a day (QID) | INTRAMUSCULAR | Status: DC | PRN

## 2016-05-10 MED ORDER — BENZOCAINE-MENTHOL 20-0.5 % EX AERO: 1.0000 "application " | INHALATION_SPRAY | CUTANEOUS | Status: DC | PRN

## 2016-05-10 MED ORDER — OXYCODONE-ACETAMINOPHEN 5-325 MG PO TABS: 2.0000 | ORAL_TABLET | ORAL | Status: DC | PRN

## 2016-05-10 MED ORDER — FLEET ENEMA 7-19 GM/118ML RE ENEM: 1.0000 | ENEMA | Freq: Every day | RECTAL | Status: DC | PRN

## 2016-05-10 MED ORDER — PHENYLEPHRINE 40 MCG/ML (10ML) SYRINGE FOR IV PUSH (FOR BLOOD PRESSURE SUPPORT)
80.0000 ug | PREFILLED_SYRINGE | INTRAVENOUS | Status: DC | PRN
  Filled 2016-05-10: qty 5
  Filled 2016-05-10: qty 10

## 2016-05-10 MED ORDER — SODIUM CHLORIDE 0.9 % IV SOLN: 250.0000 mL | INTRAVENOUS | Status: DC | PRN

## 2016-05-10 MED ORDER — OXYTOCIN 40 UNITS IN LACTATED RINGERS INFUSION - SIMPLE MED
2.5000 [IU]/h | INTRAVENOUS | Status: DC
  Filled 2016-05-10: qty 1000

## 2016-05-10 MED ORDER — MEASLES, MUMPS & RUBELLA VAC ~~LOC~~ INJ
0.5000 mL | INJECTION | Freq: Once | SUBCUTANEOUS | Status: DC
  Filled 2016-05-10: qty 0.5

## 2016-05-10 MED ORDER — LACTATED RINGERS IV SOLN
INTRAVENOUS | Status: DC
  Administered 2016-05-10: 01:00:00 via INTRAVENOUS

## 2016-05-10 MED ORDER — BISACODYL 10 MG RE SUPP: 10.0000 mg | Freq: Every day | RECTAL | Status: DC | PRN

## 2016-05-10 MED ORDER — LIDOCAINE HCL (PF) 1 % IJ SOLN
INTRAMUSCULAR | Status: DC | PRN
  Administered 2016-05-10 (×2): 5 mL

## 2016-05-10 MED ORDER — LABETALOL HCL 5 MG/ML IV SOLN
20.0000 mg | INTRAVENOUS | Status: DC | PRN
  Administered 2016-05-10: 20 mg via INTRAVENOUS

## 2016-05-10 MED ORDER — IBUPROFEN 600 MG PO TABS
600.0000 mg | ORAL_TABLET | Freq: Four times a day (QID) | ORAL | Status: DC
  Administered 2016-05-10 – 2016-05-15 (×21): 600 mg via ORAL
  Filled 2016-05-10 (×21): qty 1

## 2016-05-10 MED ORDER — SODIUM CHLORIDE 0.9 % IV SOLN
2.0000 g | Freq: Once | INTRAVENOUS | Status: AC
  Administered 2016-05-10: 2 g via INTRAVENOUS
  Filled 2016-05-10: qty 2000

## 2016-05-10 MED ORDER — SODIUM CHLORIDE 0.9% FLUSH: 3.0000 mL | INTRAVENOUS | Status: DC | PRN

## 2016-05-10 MED ORDER — DIPHENHYDRAMINE HCL 25 MG PO CAPS
25.0000 mg | ORAL_CAPSULE | Freq: Four times a day (QID) | ORAL | Status: DC | PRN
Start: 2016-05-10 — End: 2016-05-15

## 2016-05-10 MED ORDER — COCONUT OIL OIL: 1.0000 "application " | TOPICAL_OIL | Status: DC | PRN

## 2016-05-10 MED ORDER — HYDROXYZINE HCL 50 MG PO TABS
50.0000 mg | ORAL_TABLET | Freq: Four times a day (QID) | ORAL | Status: DC | PRN
  Filled 2016-05-10: qty 1

## 2016-05-10 MED ORDER — LACTATED RINGERS IV SOLN
INTRAVENOUS | Status: DC
  Administered 2016-05-10 (×2): via INTRAVENOUS

## 2016-05-10 NOTE — Anesthesia Preprocedure Evaluation (Signed)
Anesthesia Evaluation  Patient identified by MRN, date of birth, ID band Patient awake    Reviewed: Allergy & Precautions, H&P , NPO status , Patient's Chart, lab work & pertinent test results  History of Anesthesia Complications Negative for: history of anesthetic complications  Airway Mallampati: II  TM Distance: >3 FB Neck ROM: full    Dental no notable dental hx. (+) Teeth Intact   Pulmonary neg pulmonary ROS, Current Smoker,    Pulmonary exam normal breath sounds clear to auscultation       Cardiovascular negative cardio ROS Normal cardiovascular exam Rhythm:regular Rate:Normal     Neuro/Psych Depression negative neurological ROS  negative psych ROS   GI/Hepatic negative GI ROS, Neg liver ROS,   Endo/Other  negative endocrine ROSdiabetes  Renal/GU negative Renal ROS  negative genitourinary   Musculoskeletal   Abdominal   Peds  Hematology negative hematology ROS (+)   Anesthesia Other Findings Drug abuse Developmental disability Homeless Non compliance  Reproductive/Obstetrics (+) Pregnancy                             Anesthesia Physical Anesthesia Plan  ASA: II  Anesthesia Plan: Epidural   Post-op Pain Management:    Induction:   Airway Management Planned:   Additional Equipment:   Intra-op Plan:   Post-operative Plan:   Informed Consent: I have reviewed the patients History and Physical, chart, labs and discussed the procedure including the risks, benefits and alternatives for the proposed anesthesia with the patient or authorized representative who has indicated his/her understanding and acceptance.     Plan Discussed with:   Anesthesia Plan Comments:         Anesthesia Quick Evaluation

## 2016-05-10 NOTE — Anesthesia Postprocedure Evaluation (Signed)
Anesthesia Post Note  Patient: Mary Ramirez  Procedure(s) Performed: * No procedures listed *  Patient location during evaluation: [facility] Anesthesia Type: Epidural Level of consciousness: awake and alert and oriented Pain management: pain level controlled Vital Signs Assessment: post-procedure vital signs reviewed and stable Respiratory status: spontaneous breathing and nonlabored ventilation Cardiovascular status: stable Postop Assessment: no headache, no backache, epidural receding, patient able to bend at knees, no signs of nausea or vomiting and adequate PO intake Anesthetic complications: no        Last Vitals:  Vitals:   05/10/16 0857 05/10/16 0915  BP: 133/81   Pulse: 87   Resp: 20 18  Temp: 36.6 C     Last Pain:  Vitals:   05/10/16 0915  TempSrc:   PainSc: 0-No pain   Pain Goal: Patients Stated Pain Goal: 0 (05/10/16 0800)               Laban Emperor
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
## 2016-05-10 NOTE — Anesthesia Procedure Notes (Signed)
Epidural Patient location during procedure: OB  Staffing Anesthesiologist: Maryhelen Lindler Performed: anesthesiologist   Preanesthetic Checklist Completed: patient identified, site marked, surgical consent, pre-op evaluation, timeout performed, IV checked, risks and benefits discussed and monitors and equipment checked  Epidural Patient position: sitting Prep: DuraPrep Patient monitoring: heart rate, continuous pulse ox and blood pressure Approach: right paramedian Location: L3-L4 Injection technique: LOR saline  Needle:  Needle type: Tuohy  Needle gauge: 17 G Needle length: 9 cm and 9 Needle insertion depth: 6 cm Catheter type: closed end flexible Catheter size: 20 Guage Catheter at skin depth: 10 cm Test dose: negative  Assessment Events: blood not aspirated, injection not painful, no injection resistance, negative IV test and no paresthesia  Additional Notes Patient identified. Risks/Benefits/Options discussed with patient including but not limited to bleeding, infection, nerve damage, paralysis, failed block, incomplete pain control, headache, blood pressure changes, nausea, vomiting, reactions to medication both or allergic, itching and postpartum back pain. Confirmed with bedside nurse the patient's most recent platelet count. Confirmed with patient that they are not currently taking any anticoagulation, have any bleeding history or any family history of bleeding disorders. Patient expressed understanding and wished to proceed. All questions were answered. Sterile technique was used throughout the entire procedure. Please see nursing notes for vital signs. Test dose was given through epidural needle and negative prior to continuing to dose epidural or start infusion. Warning signs of high block given to the patient including shortness of breath, tingling/numbness in hands, complete motor block, or any concerning symptoms with instructions to call for help. Patient was given  instructions on fall risk and not to get out of bed. All questions and concerns addressed with instructions to call with any issues.     

## 2016-05-10 NOTE — Progress Notes (Signed)
CSW met with [name] and [name]'s case manager, Nakia Brown, from [employer]. [name] was on Magnesium and CSW suggested that CSW return when [name]'s Magnesium is discontinued; [name] agreed.  [name] communicated that [name] wants [name]'s case manger to be present when CSW meets with [name].  CSW will meet with [name] and [name]'s case manager at 10:15am on 05/11/16.   Braydin Aloi Boyd-Gilyard, MSW, LCSW Clinical Social Work [phone]    

## 2016-05-11 LAB — RUBELLA SCREEN: Rubella: 2.51 index (ref >0.99)

## 2016-05-11 LAB — RPR: RPR Ser Ql: NONREACTIVE

## 2016-05-11 LAB — HIV ANTIBODY (ROUTINE TESTING W REFLEX): HIV SCREEN 4TH GENERATION: NONREACTIVE

## 2016-05-11 NOTE — Progress Notes (Signed)
CSW received call from beside RN stating that Ty Richards/CPS worker is here to meet with patient.  CSW met with Ms. Celesta Aver who states patient will not speak with her until her case worker from the [facility] is present.  Ms. Celesta Aver states that the case worker has agreed to return to the hospital now.  CSW requested that Ms. Richards call CSW after completing assessment with patient.  CSW agreed and states that her supervisor/Gail Myrtie Neither will be available for questions tomorrow, as she will be out of the office.

## 2016-05-11 NOTE — Clinical Social Work Maternal (Signed)
CLINICAL SOCIAL WORK MATERNAL/CHILD NOTE  Patient Details  Name: Mary Ramirez MRN: [id number] Date of Birth: 1980/03/01  Date:  05/11/2016  Clinical Social Worker Initiating Note:  Laurey Arrow Date/ Time Initiated:  05/11/16/1015     Child's Name:  Mary Ramirez   Legal Guardian:  Mother (FOB is Mary Ramirez DOB unknown)   Need for Interpreter:  None   Date of Referral:  05/10/16     Reason for Referral:  Behavioral Health Issues, including SI    Referral Source:  Central Nursery   Address:  [name] is homeless; Mailing address is for [facility] [address] [place] [address]  Phone number:  [phone]   Household Members:  Other (Comment) ([name] is homeless)   Natural Supports (not living in the home):  Other (Comment) (No family support reported)   Professional Supports: Case Metallurgist (CSW has a Tourist information centre manager with [employer])   Employment: Unemployed   Type of Work:     Education:      Pensions consultant:  [employer]   Other Resources:  [employer], Physicist, medical    Cultural/Religious Considerations Which May Impact Care:  None Reported  Strengths:  Understanding of illness, Other (Comment) ([name] works well with [name]'s case managers from [employer].)   Risk Factors/Current Problems:  Mary Ramirez Concerns , Substance Use , DHHS Involvement , Other (Comment) (homelessness)   Cognitive State:  Able to Concentrate , Alert , Linear Thinking    Mood/Affect:  Bright , Happy , Interested , Comfortable , Irritable , Anxious    CSW Assessment: CSW met with [name] to complete an assessment for MH hx, SA hx, and homelessness. When CSW arrived [name] was appropriately holding infant while [name]'s case manager Mary Ramirez and [place] ??) from [facility] were observing [name].  CSW gave [name] permission to complete clinical assessment while [name]'s case managers were present. [name] was engaging and receptive to meeting with CSW.  Throughout the assessment [name]'s mood/affect varied as  CSW asked various questions.  [name] demonstrated anxiousness as CSW made [name] aware about CPS report.  [name] also appeared irritable as CSW asked questions about [name]'s CPS hx and custody of [name]'s older children.   CSW inquired about [name]'s MH hx and [name] acknowledged a MH hx (depression and PTSD).  [name] gave CSW little to no information regarding [name]'s PTSD.  [name] was not a good historian was unable to chronologically provide CSW information. [name] denied a medication regiment and denied outpatient counseling services.  CSW educated [name] about PPD. CSW informed [name] of possible supports and interventions to decrease PPD.  CSW also encouraged [name] to seek medical attention if needed for increased signs and symptoms for PPD.  CSW inquired about [name]'s SA use and [name] acknowledged the use of marijuana throughout pregnancy.  [name] reported [name]'s last use was 2-3 weeks ago. CSW explained to [name] the hospital's policy and procedure regarding substance use. CSW informed [name] of the two screenings for the infant. CSW informed [name] that the infant's UDS was negative and CSW will continue to monitor infant's CDS. CSW made [name] aware that if infant's CDS is positive without an explanation, CSW will make a report to [facility] CPS. [name] acknowledged CPS hx and reported that [name] currently has 2 children in CPS custody in [place] [name] 5//21/01 and [name] 11/06/2000). [name] reported that [name]'s children have been in custody for the past 3 years and [name]'s rights were terminated.  CSW asked about [name]'s plan to provided housing  for infant and [name] did not have a plan.  [name] reported that living in a shelter was not an option due to cleanliness, germs, and the risk of infant dying while living in a shelter.  [name]'s case manager reported that [facility] has been working with other agencies to assist [name] with securing stable housing, however, it may take an additional 2-3 weeks.  CSW was understanding and encouraged [name] to continue to work toward  finding affordable housing.  CSW made [name] aware that CSW will make a CPS report due to [name]'s housing status and [name] not being prepared to care for infant at d/c. At this time, [name] only has items that has been provided to [facility] by the [facility] (bassinet, car seat, and clothing). [name] was understanding of CPS report and was hopeful that CPS will be a resource for [name] and infant.    CSW provided [name] SIDS education;[name] asked appropriate questions and responded approprietly to CSW questions.    Throughout the assessment [name] was attentive to infant and demonstrated affection and love.   CSW made a CPS report with [place] worker, Wendall Stade. At this time there are barriers to d/c.  CPS will follow-up with CSW within 24 hours.   CSW Plan/Description:  Information/Referral to [employer] , Engineer, mining , Child Protective Service Report  (CPS report made to [place] for homelessness concerns. )   Laurey Arrow, MSW, LCSW Clinical Social Work [phone]  Dimple Nanas, LCSW 05/11/2016, 2:28 PM

## 2016-05-11 NOTE — Progress Notes (Signed)
Patient ID: Mary Ramirez, female   DOB: 1980-04-06, 36 y.o.   MRN: [id number] Post Partum Day 1 Subjective:  Mary Ramirez is a 36 y.o. [id number] [redacted]w[redacted]d s/p svd  on 4/11.  No acute events overnight.  Pt denies problems with ambulating, voiding or po intake.  She denies nausea or vomiting.  Pain is well controlled.  She has had flatus. She has not had bowel movement.  Lochia Minimal.  Plan for birth control initially was bilateral tubal ligation but later declined, now undecided.  Method of Feeding: Bottle  Objective: Blood pressure 115/61, pulse 79, temperature 97.9 F (36.6 C), temperature source Oral, resp. rate 18, height  (1.6 m), weight 201 lb (91.2 kg), last menstrual period 07/08/2015, SpO2 100 %, unknown if currently breastfeeding.  Physical Exam:  General: alert, cooperative and no distress Lochia:normal flow Chest: CTAB Heart: RRR no m/r/g Abdomen: +BS, soft, nontender,  Uterine Fundus: firm, 2cm below umbilicus DVT Evaluation: No evidence of DVT seen on physical exam. Extremities: trace edema   Recent Labs  05/10/16 0046  HGB 11.0*  HCT 34.8*    Assessment/Plan:  ASSESSMENT: Mary Ramirez is a 36 y.o. [id number] [redacted]w[redacted]d s/p NSVD PPD#1  NSVD PPD #1 -pain well controlled, prn ibuprofen available -minimal lochia -discharge pending social work consult -GBS positive, treated with amp -initially planned for BTL, now declines  Severe preE -Off Magnesium as of 0400AM -BP range: 112/56-136/85 -no HA, blurry vision, RUQ pain, or increased edema -continue to monitor for need for prn antihypertensive meds  A1DM, uncontrolled -have not been checking POC glucose levels, last was   Hypothyroidism -continue synthroid po qAM  Homelessness and h/o PTSD, SI, Depression -does not have custody of other  children -SW consult today with her case manager this AM   LOS: 1 day   Mary Ramirez 05/11/2016, 7:33 AM   OB FELLOW POSTPARTUM PROGRESS NOTE  ATTESTATION  I confirm that I have verified the information documented in the resident's note and that I have also personally reperformed the physical exam and all medical decision making activities.   Ernestina Penna, MD 9:31 AM

## 2016-05-11 NOTE — Progress Notes (Signed)
Initial visit with Dorna and baby.  Introduced spiritual care services and Chaplain had been notified of patient's significant stressors and attempted to offer support, however patient was resistant to any conversation.   Please page as further needs arise.  Maryanna Shape. Carley Hammed, M.Div. [facility] Chaplain Pager [phone] Office [phone]

## 2016-05-12 NOTE — Progress Notes (Signed)
CSW met with [name], CPS Supervisor [name]),and [name]'s [facility] Case Manger [name]) for a scheduled meeting in classroom 4.  [name] was emotional during the entire meeting and was hesitant to provided CPS with little to no information regarding [name]'s CPS involvement, and family hx. CPS informed [name] that [name]'s parental rights were terminated with [name]'s older 2 children (in [place]) this week.  [name] appeared surprised and was not understanding how parental rights termination affects [name]'s current CPS investigation.  CPS determined that CPS needs additional time to conduct a thorough investigation to make an informed safety plan for infant.  CPS plans to have a CFT at [facility] on Monday, 05/15/16 and will update CSW with the time. CSW will reserve a meeting room once CPS confirm a meeting time.   Laurey Arrow, MSW, LCSW Clinical Social Work [phone]

## 2016-05-12 NOTE — Progress Notes (Signed)
Patient ID: Mary Ramirez, female   DOB: 03/01/1980, 36 y.o.   MRN: [id number] Post Partum Day 1 Subjective:  Mary Ramirez is a 36 y.o. [id number] [redacted]w[redacted]d s/p svd  on 4/11 complicated by severe preeclampsia.  No acute events overnight.  Pt denies problems with ambulating, voiding or po intake.  She denies nausea or vomiting.  Pain is well controlled.  She has had flatus. She has not had bowel movement.  Lochia Minimal.  Plan for birth control initially was bilateral tubal ligation but later declined, now undecided.  Method of Feeding: Bottle.  Patient has a very flat affect, does not converse much. Baby is in bed with patient.   Objective: Blood pressure 129/64, pulse 67, temperature 98.4 F (36.9 C), temperature source Oral, resp. rate 16, height  (1.6 m), weight 201 lb (91.2 kg), last menstrual period 07/08/2015, SpO2 99 %, unknown if currently breastfeeding.  Physical Exam:  General: alert, cooperative and no distress Lochia:normal flow Chest: CTAB Heart: RRR no m/r/g Abdomen: +BS, soft, nontender,  Uterine Fundus: firm, 2cm below umbilicus DVT Evaluation: No evidence of DVT seen on physical exam. Extremities: trace edema   Recent Labs  05/10/16 0046  HGB 11.0*  HCT 34.8*    Assessment/Plan:  ASSESSMENT: Mary Ramirez is a 36 y.o. [id number] [redacted]w[redacted]d s/p NSVD PPD#2  NSVD PPD #1 -pain well controlled, prn ibuprofen available -minimal lochia -discharge is still pending social work consult -initially planned for BTL, now declines, undecided for contraception  Severe preE -Off Magnesium  - Stable BP -no HA, blurry vision, RUQ pain, or increased edema -continue to monitor for need for prn antihypertensive meds  A1DM, uncontrolled -have not been checking POC glucose levels, last was 160  Hypothyroidism -continue synthroid po qAM  Homelessness and h/o PTSD, SI, Depression -does not have custody of other  children -SW consult today with her case manager  this AM   LOS: 2 days   Jaynie Collins, MD 05/12/2016, 7:25 AM

## 2016-05-12 NOTE — Progress Notes (Signed)
CSW spoke with CPS Supervisor, Gail Spinks, and CFT is scheduled for [name] on Monday (05/15/16) at 1pm.  CSW will update [name] and [name]'s IRC team.  Yuka Lallier Boyd-Gilyard, MSW, LCSW Clinical Social Work [phone] 

## 2016-05-13 MED ORDER — SERTRALINE HCL 25 MG PO TABS
25.0000 mg | ORAL_TABLET | Freq: Every day | ORAL | Status: DC
  Administered 2016-05-13 – 2016-05-15 (×3): 25 mg via ORAL
  Filled 2016-05-13 (×4): qty 1

## 2016-05-13 NOTE — Progress Notes (Signed)
Patient ID: Mary Ramirez, female   DOB: 1980/10/30, 36 y.o.   MRN: [id number]  POSTPARTUM PROGRESS NOTE  Post Partum Day #3 Subjective:  Mary Ramirez is a 36 y.o. [id number] [redacted]w[redacted]d s/p NSVD.  No acute events overnight.  Pt denies problems with ambulating, voiding or po intake.  She denies nausea or vomiting.  Pain is well controlled.  She has had flatus. She has had bowel movement.  Lochia Minimal. Patient was slightly anxious when talking to but reasonable. Patient often went on tangential thoughts and had to be redirected to conversation. States she is feeling slightly depressed and anxious with the situation, and states she will not allow the baby to be taken by [facility]. Does plan on going to the apartment the SW team has set up, but it won't be ready for a couple weeks, in the meantime she states she would prefer to live on the street. She states she'd prefer this due to shelters are dirty, have lots of drugs in them, and she feels unsafe there. She has lived on the streets before, and states she will do fine.   Objective: Blood pressure (!) 146/81, pulse 81, temperature 98.5 F (36.9 C), temperature source Oral, resp. rate 18, height  (1.6 m), weight 201 lb (91.2 kg), last menstrual period 07/08/2015, SpO2 97 %, unknown if currently breastfeeding.  Physical Exam:  General: alert, cooperative and no distress Lochia:normal flow Chest: CTAB Heart: RRR no m/r/g Abdomen: +BS, soft, nontender,  Uterine Fundus: firm, below umbilicus DVT Evaluation: No calf swelling or tenderness Extremities: No edema Psych: No suicidal ideations, tangential thoughts, anxious appearing, needs frequent redirecting to stay on conversation, fixates on what to do about the baby and keeping the baby. Pacing in the room.   No results for input(s): HGB, HCT in the last 72 hours.  Assessment/Plan:  ASSESSMENT: Mary Ramirez is a 36 y.o. [id number] [redacted]w[redacted]d s/p NSVD.   #1 PPD#3 vaginal delivery - Routine care -  Ibuprofen for pain, well controlled - Contraception: NEXPLANON  #2 Depression/Anxiety w/ h/o PTSD and SI - Patient would like to start Zoloft, but may need assistance from SW to help obtain after discharge (monetary concerns). Patient states she has been on this in the past and worked well for her and would not mind going back on it if she can get help getting it outpatient.  #3 Hypothyroidism - Synthroid 125 mcg qAM, may need help from SW to obtain after discharge (monetary concerns)  #4 Homelessness w/ h/o PTSD, SI, depression - SW consulted - Prefers to live on street than a shelter - Has APT set up in a couple weeks by SW - CPS involved regarding plan for baby (recently had parental rights revoked with previous two teenage children)  #5 Severe preE - Stable, off magnesium - BPs stable without medications - No signs/symptoms  #6 A1DM, uncontrolled - Will perform AM fasting CBG  DISPO: possible d/c tomorrow vs Monday after placement for baby.   LOS: 3 days   Jen Mow, DO OB Fellow Center for [facility], [facility]  05/13/2016, 10:17 PM

## 2016-05-14 LAB — GLUCOSE, RANDOM: Glucose, Bld: 93 mg/dL (ref 65–99)

## 2016-05-14 NOTE — Progress Notes (Signed)
Patient ID: Mary Ramirez, female   DOB: 03-Jun-1980, 36 y.o.   MRN: [id number]  POSTPARTUM PROGRESS NOTE  Post Partum Day #4 Subjective:  Mary Ramirez is a 36 y.o. [id number] [redacted]w[redacted]d s/p NSVD.  No acute events overnight.  Pt denies problems with ambulating, voiding or po intake.  She denies nausea or vomiting.  Pain is well controlled.  She has had flatus. She has had bowel movement.  Lochia Minimal. Patient was slightly anxious when talking to but reasonable. Patient often went on tangential thoughts and had to be redirected to conversation. States she is feeling slightly depressed and anxious with the situation, and states she will not allow the baby to be taken by [facility]. Does plan on going to the apartment the SW team has set up, but it won't be ready for a couple weeks, in the meantime she states she would prefer to live on the street. She states she'd prefer this due to shelters are dirty, have lots of drugs in them, and she feels unsafe there. She has lived on the streets before, and states she will do fine.   Objective: Vitals:   05/13/16 1215 05/13/16 1625 05/13/16 1928 05/14/16 0830  BP: 130/73 (!) 156/88 (!) 146/81 131/71  Pulse: 85 85 81 66  Resp: Temp: 97.9 F (36.6 C) 98.2 F (36.8 C) 98.5 F (36.9 C) 97.8 F (36.6 C)  TempSrc:   Oral   SpO2: 100% 99% 97%   Weight:      Height:        Physical Exam:  General: alert, cooperative and no distress Lochia:normal flow Abdomen: +BS, soft, nontender,  Uterine Fundus: firm, below umbilicus DVT Evaluation: No calf swelling or tenderness Extremities: Mild edema Psych: No suicidal ideations, tangential thoughts, anxious appearing, needs frequent redirecting to stay on conversation, fixates on what to do about the baby and keeping the baby. Pacing in the room.   No results for input(s): HGB, HCT in the last 72 hours.  Assessment/Plan:  ASSESSMENT: Mary Ramirez is a 36 y.o. [id number] [redacted]w[redacted]d s/p NSVD.   #1 PPD#4  vaginal delivery - Routine care - Ibuprofen for pain, well controlled - Contraception: NEXPLANON  #2 Depression/Anxiety w/ h/o PTSD and SI - Patient would like to start Zoloft.  Care Mgmt to hep with med assistance.  #3 Hypothyroidism - Synthroid 125 mcg qAM,Care Mgmt to hep with med assistance  #4 Homelessness w/ h/o PTSD, SI, depression - SW consulted - Prefers to live on street than a shelter - Has APT set up in a couple weeks by SW - CPS involved regarding plan for baby (recently had parental rights revoked with previous two teenage children)  #5 Severe preE - Stable, off magnesium - BPs stable without medications - No signs/symptoms  #6  GDM - Fasting is 93 today.  No further monitoring.  DISPO: possible d/c tomorrow vs Monday after placement for baby.   LOS: 4 days   Elsie Lincoln, MD

## 2016-05-15 ENCOUNTER — Encounter (HOSPITAL_COMMUNITY): Payer: Self-pay | Admitting: Family Medicine

## 2016-05-15 ENCOUNTER — Encounter: Payer: Self-pay | Admitting: Family Medicine

## 2016-05-15 MED ORDER — LIDOCAINE HCL 1 % IJ SOLN: 0.0000 mL | Freq: Once | INTRAMUSCULAR | Status: DC | PRN

## 2016-05-15 MED ORDER — ETONOGESTREL 68 MG ~~LOC~~ IMPL: 68.0000 mg | DRUG_IMPLANT | Freq: Once | SUBCUTANEOUS | Status: DC

## 2016-05-15 MED ORDER — IBUPROFEN 600 MG PO TABS: 600.0000 mg | ORAL_TABLET | Freq: Four times a day (QID) | ORAL | 0 refills | Status: DC

## 2016-05-15 MED ORDER — LEVOTHYROXINE SODIUM 125 MCG PO TABS: 125.0000 ug | ORAL_TABLET | Freq: Every day | ORAL | 0 refills | Status: DC

## 2016-05-15 MED ORDER — MEDROXYPROGESTERONE ACETATE 150 MG/ML IM SUSP
150.0000 mg | Freq: Once | INTRAMUSCULAR | Status: AC
  Administered 2016-05-15: 150 mg via INTRAMUSCULAR
  Filled 2016-05-15: qty 1

## 2016-05-15 MED ORDER — SERTRALINE HCL 50 MG PO TABS: 50.0000 mg | ORAL_TABLET | Freq: Every day | ORAL | 1 refills | Status: DC

## 2016-05-15 MED FILL — LEVOTHYROXINE 125 MCG TABLE: 125 | 30 days supply | Qty: 30 | Fill #0

## 2016-05-15 MED FILL — SERTRALINE HCL 50 MG TABLET: 50 | 30 days supply | Qty: 30 | Fill #0

## 2016-05-15 MED FILL — IBUPROFEN 600 MG TABLET: 600 | 7 days supply | Qty: 30 | Fill #0

## 2016-05-15 NOTE — Progress Notes (Signed)
CSW confirmed that [name] and infant will reside at [facility] until [name] housing is available through [facility]. [name]'s d/c plans were confirmed with with CPS and there are no barriers to d/c.  Mary Ramirez, MSW, LCSW Clinical Social Work [phone]  

## 2016-05-15 NOTE — Care Management (Signed)
CM spoke to [place] at [employer] and [phone] and he stated he would waive the $3.00 co- pay for the medications for patient at discharge.  Patient does have active Medicaid.  Prescriptions will need to be sent to the [facility].  Donnelly Stager # [phone] the [facility] stated that is where she would like to go to pick up medicines. Information given to [facility] and patient and nurse on floor.

## 2016-05-15 NOTE — Progress Notes (Signed)
CSW spoke with CPS Supervisor, Gail Spinks, regarding safety d/c plan for infant.  CPS informed CSW that CSW will not conduct a CFT due to information received from [place].  CPS informed CSW that there are no barrier for infant to be d/c to [name].    Mary Ramirez, MSW, LCSW Clinical Social Work [phone]  

## 2016-05-15 NOTE — Care Management (Signed)
CM called the [facility] in [facility] and spoke to Viviann Spare the Director [phone] regarding patient and needing possible place to stay with she and infant for up to 10-30 days until housing goes through.  Dara stated she would accept patient.   CM also called [facility] in [place] [phone] this place is not a shelter but is a safe house the Chiropodist stated- Melene Plan.  Cassell Clement Stated she would accept patient also as of today but would need transportation to police station then they would take patient to safe house.  They provide transportation for she and baby to Dr. Algie Coffer and meals and can stay here there for up to 30 days. The on- call administrator number is # [phone]. Both options given to Donnelly Stager- the Interactive Therapist, nutritional to review with patient.  Nakia to follow up with CM.  CSW made aware.  Meeting with CPS  planned for 1:00 to determine plan.

## 2016-05-15 NOTE — Progress Notes (Signed)
Discharge education complete, discharge instructions and follow up appointment discussed. Patient verbalized understanding. 

## 2016-05-15 NOTE — Care Management (Signed)
Per CSW that patient would be discharging with infant today and that there will be no team meeting with CPS today.  Patient and Pryor Montes wanting information regarding [facility] in [place] [phone].  CM and Asher Muir AD in room with patient and Donnelly Stager the Tax adviser in room. The [facility] reviewed with patient.  The AD there is Melene Plan.  Currently they can accept her and provide food and shelter and transportation for she and baby's appointments and she and baby can stay there for up to 30 days per the AD there Melene Plan until patient's permanent housing is available.  Per Pryor Montes and patient they will discuss options about going to the [employer].  CSW made aware.

## 2016-05-15 NOTE — Discharge Summary (Signed)
OB Discharge Summary     Patient Name: Mary Ramirez DOB: 07-14-1980 MRN: [id number]  Date of admission: 05/10/2016 Delivering MD: Shawna Clamp R   Date of discharge: 05/15/2016  Admitting diagnosis: CTX Intrauterine pregnancy: [redacted]w[redacted]d     Secondary diagnosis:  Active Problems:   PTSD (post-traumatic stress disorder)   Hypothyroidism affecting pregnancy, antepartum   Homelessness   Indication for care or intervention in labor or delivery   Preeclampsia, severe, third trimester   NSVD (normal spontaneous vaginal delivery)  Additional problems: None     Discharge diagnosis: Term Pregnancy Delivered, Preeclampsia (severe) and Hypothyroidism and Homelessness and PTSD                                                                                                Post partum procedures:Depo Provera injection  Augmentation: AROM  Complications: None  Hospital course:  Onset of Labor With Vaginal Delivery     36 y.o. yo [id number] at [redacted]w[redacted]d was admitted in Active Labor on 05/10/2016. Patient had an uncomplicated labor course as follows:  Membrane Rupture Time/Date: 4:08 AM ,05/10/2016   Intrapartum Procedures: Episiotomy: None [1]                                         Lacerations:  1st degree [2];Perineal [11]  Patient had a delivery of a Viable infant. 05/10/2016  Information for the patient's newborn:  Meeka, Cartelli [[id number]]  Delivery Method: Vaginal, Spontaneous Delivery (Filed from Delivery Summary)    Pateint had an uncomplicated postpartum course.  She is ambulating, tolerating a regular diet, passing flatus, and urinating well. Case Management/Social work has worked with the patient for placement, has found a halfway house for patient to stay with the baby for a few days, then an apartment has been ready with a program for the patient (please see SW note). She has medications that are sent to her pharmacy, with the $3 copay being waived. She received Depo in the  hospital until she is able to follow up in office in 1 week for the Nexplanon (transportation will be arranged). CPS cleared for patient to have baby discharged with her as long as she has housing.  Patient is discharged home to the halfway house in stable condition on 05/15/16.   Physical exam  Vitals:   05/13/16 1928 05/14/16 0830 05/14/16 2042 05/15/16 0832  BP: (!) 146/81 131/71 (!) 155/87 137/84  Pulse: 81 66 70 66  Resp: Temp: 98.5 F (36.9 C) 97.8 F (36.6 C) 98.4 F (36.9 C) 98.2 F (36.8 C)  TempSrc: Oral  Oral Oral  SpO2: 97%  100% 99%  Weight:      Height:       General: alert, cooperative and no distress Lochia: appropriate Uterine Fundus: firm Incision: N/A DVT Evaluation: No evidence of DVT seen on physical exam. Negative Homan's sign. No cords or calf tenderness. Labs: Lab Results  Component Value Date   WBC 13.1 (H) 05/10/2016  HGB 11.0 (L) 05/10/2016   HCT 34.8 (L) 05/10/2016   MCV 79.6 05/10/2016   PLT 326 05/10/2016   CMP Latest Ref Rng & Units 05/14/2016  Glucose 65 - 99 mg/dL 93  BUN 6 - 20 mg/dL -  Creatinine 1.61 - 0.96 mg/dL -  Sodium 045 - 409 mmol/L -  Potassium 3.5 - 5.1 mmol/L -  Chloride 101 - 111 mmol/L -  CO2 22 - 32 mmol/L -  Calcium 8.9 - 10.3 mg/dL -  Total Protein 6.5 - 8.1 g/dL -  Total Bilirubin 0.3 - 1.2 mg/dL -  Alkaline Phos 38 - 811 U/L -  AST 15 - 41 U/L -  ALT 14 - 54 U/L -    Discharge instruction: per After Visit Summary and "Baby and Me Booklet".  After visit meds:  Allergies as of 05/15/2016   No Known Allergies     Medication List    TAKE these medications   ibuprofen 600 MG tablet Commonly known as:  ADVIL,MOTRIN Take 1 tablet (600 mg total) by mouth every 6 (six) hours.   levothyroxine 125 MCG tablet Commonly known as:  SYNTHROID, LEVOTHROID Take 1 tablet (125 mcg total) by mouth daily before breakfast. Start taking on:  05/16/2016   prenatal vitamin w/FE, FA 27-1 MG Tabs tablet Take 1  tablet by mouth daily at 12 noon.   sertraline 50 MG tablet Commonly known as:  ZOLOFT Take 1 tablet (50 mg total) by mouth daily. Start taking on:  05/16/2016       Diet: routine diet  Activity: Advance as tolerated. Pelvic rest for 6 weeks.   Outpatient follow up:1 week for BP check and Nexplanon placement Follow up Appt:Future Appointments Date Time Provider Department Center  06/22/2016 1:20 PM Hurshel Party, CNM [facility]   Follow up Visit:No Follow-up on file.  Postpartum contraception: Depo Provera and Nexplanon  Newborn Data: Live born female  Birth Weight: 7 lb 4.9 oz (3315 g) APGAR: 7, 9  Baby Feeding: Bottle Disposition:Baby home with mother to halfway house then to arranged apartment.   05/15/2016 Jen Mow, DO OB Fellow

## 2016-05-15 NOTE — Care Management (Signed)
CM spoke to [place] at [facility] # [phone] and verified housing for patient this pm. She stated they are expecting her.  Notified her that she would arriving sometime after 7:00 pm tonight.  She stated that would be fine.  Plan is for patient to be transported by Donnelly Stager the case manager from the IRR center to the [facility] # [phone] at [address] [place], [place] [address] and patient and Pryor Montes will be escorted to the [facility]) by the [employer] per [employer].  Phone numbers and contact info given to [facility] of above information. Pryor Montes will follow up tomorrow Tuesday 05/16/16 and pick up prescriptions for Mary Ramirez (patient) b/c pharmacy closes at 6:00 pm today and she is a late discharge. [facility] will provide transportation to MD appointments but would like a 24 hour notice if possible.  Baby's appointment is tomorrow at the [facility] at 2:00 and Pryor Montes the case manager will provide transportation for this appointment.   Plan is for patient to have nexplanon on an outpatient basis and Dr. Omer Jack will have office call CM with f/u appointment when made and CM will follow up with Pryor Montes the IRR case manager.

## 2016-05-15 NOTE — Discharge Instructions (Signed)
Vaginal Delivery, Care After Refer to this sheet in the next few weeks. These instructions provide you with information about caring for yourself after vaginal delivery. Your health care provider may also give you more specific instructions. Your treatment has been planned according to current medical practices, but problems sometimes occur. Call your health care provider if you have any problems or questions. What can I expect after the procedure? After vaginal delivery, it is common to have:  Some bleeding from your vagina.  Soreness in your abdomen, your vagina, and the area of skin between your vaginal opening and your anus (perineum).  Pelvic cramps.  Fatigue. Follow these instructions at home: Medicines   Take over-the-counter and prescription medicines only as told by your health care provider.  If you were prescribed an antibiotic medicine, take it as told by your health care provider. Do not stop taking the antibiotic until it is finished. Driving    Do not drive or operate heavy machinery while taking prescription pain medicine.  Do not drive for 24 hours if you received a sedative. Lifestyle   Do not drink alcohol. This is especially important if you are breastfeeding or taking medicine to relieve pain.  Do not use tobacco products, including cigarettes, chewing tobacco, or e-cigarettes. If you need help quitting, ask your health care provider. Eating and drinking   Drink at least 8 eight-ounce glasses of water every day unless you are told not to by your health care provider. If you choose to breastfeed your baby, you may need to drink more water than this.  Eat high-fiber foods every day. These foods may help prevent or relieve constipation. High-fiber foods include:  Whole grain cereals and breads.  Brown rice.  Beans.  Fresh fruits and vegetables. Activity   Return to your normal activities as told by your health care provider. Ask your health care provider  what activities are safe for you.  Rest as much as possible. Try to rest or take a nap when your baby is sleeping.  Do not lift anything that is heavier than your baby or 10 lb (4.5 kg) until your health care provider says that it is safe.  Talk with your health care provider about when you can engage in sexual activity. This may depend on your:  Risk of infection.  Rate of healing.  Comfort and desire to engage in sexual activity. Vaginal Care   If you have an episiotomy or a vaginal tear, check the area every day for signs of infection. Check for:  More redness, swelling, or pain.  More fluid or blood.  Warmth.  Pus or a bad smell.  Do not use tampons or douches until your health care provider says this is safe.  Watch for any blood clots that may pass from your vagina. These may look like clumps of dark red, brown, or black discharge. General instructions   Keep your perineum clean and dry as told by your health care provider.  Wear loose, comfortable clothing.  Wipe from front to back when you use the toilet.  Ask your health care provider if you can shower or take a bath. If you had an episiotomy or a perineal tear during labor and delivery, your health care provider may tell you not to take baths for a certain length of time.  Wear a bra that supports your breasts and fits you well.  If possible, have someone help you with household activities and help care for your baby for  at least a few days after you leave the hospital.  Keep all follow-up visits for you and your baby as told by your health care provider. This is important. Contact a health care provider if:  You have:  Vaginal discharge that has a bad smell.  Difficulty urinating.  Pain when urinating.  A sudden increase or decrease in the frequency of your bowel movements.  More redness, swelling, or pain around your episiotomy or vaginal tear.  More fluid or blood coming from your episiotomy or  vaginal tear.  Pus or a bad smell coming from your episiotomy or vaginal tear.  A fever.  A rash.  Little or no interest in activities you used to enjoy.  Questions about caring for yourself or your baby.  Your episiotomy or vaginal tear feels warm to the touch.  Your episiotomy or vaginal tear is separating or does not appear to be healing.  Your breasts are painful, hard, or turn red.  You feel unusually sad or worried.  You feel nauseous or you vomit.  You pass large blood clots from your vagina. If you pass a blood clot from your vagina, save it to show to your health care provider. Do not flush blood clots down the toilet without having your health care provider look at them.  You urinate more than usual.  You are dizzy or light-headed.  You have not breastfed at all and you have not had a menstrual period for 12 weeks after delivery.  You have stopped breastfeeding and you have not had a menstrual period for 12 weeks after you stopped breastfeeding. Get help right away if:  You have:  Pain that does not go away or does not get better with medicine.  Chest pain.  Difficulty breathing.  Blurred vision or spots in your vision.  Thoughts about hurting yourself or your baby.  You develop pain in your abdomen or in one of your legs.  You develop a severe headache.  You faint.  You bleed from your vagina so much that you fill two sanitary pads in one hour. This information is not intended to replace advice given to you by your health care provider. Make sure you discuss any questions you have with your health care provider. Document Released: 01/14/2000 Document Revised: 06/30/2015 Document Reviewed: 01/31/2015 Elsevier Interactive Patient Education  2017 Elsevier Inc.  Preeclampsia and Eclampsia Preeclampsia is a serious condition that develops only during pregnancy. It is also called toxemia of pregnancy. This condition causes high blood pressure along with  other symptoms, such as swelling and headaches. These symptoms may develop as the condition gets worse. Preeclampsia may occur at 20 weeks of pregnancy or later. Diagnosing and treating preeclampsia early is very important. If not treated early, it can cause serious problems for you and your baby. One problem it can lead to is eclampsia, which is a condition that causes muscle jerking or shaking (convulsions or seizures) in the mother. Delivering your baby is the best treatment for preeclampsia or eclampsia. Preeclampsia and eclampsia symptoms usually go away after your baby is born. What are the causes? The cause of preeclampsia is not known. What increases the risk? The following risk factors make you more likely to develop preeclampsia:  Being pregnant for the first time.  Having had preeclampsia during a past pregnancy.  Having a family history of preeclampsia.  Having high blood pressure.  Being pregnant with twins or triplets.  Being 33 or older.  Being African-American.  Having  kidney disease or diabetes.  Having medical conditions such as lupus or blood diseases.  Being very overweight (obese). What are the signs or symptoms? The earliest signs of preeclampsia are:  High blood pressure.  Increased protein in your urine. Your health care provider will check for this at every visit before you give birth (prenatal visit). Other symptoms that may develop as the condition gets worse include:  Severe headaches.  Sudden weight gain.  Swelling of the hands, face, legs, and feet.  Nausea and vomiting.  Vision problems, such as blurred or double vision.  Numbness in the face, arms, legs, and feet.  Urinating less than usual.  Dizziness.  Slurred speech.  Abdominal pain, especially upper abdominal pain.  Convulsions or seizures. Symptoms generally go away after giving birth. How is this diagnosed? There are no screening tests for preeclampsia. Your health care  provider will ask you about symptoms and check for signs of preeclampsia during your prenatal visits. You may also have tests that include:  Urine tests.  Blood tests.  Checking your blood pressure.  Monitoring your babys heart rate.  Ultrasound. How is this treated? You and your health care provider will determine the treatment approach that is best for you. Treatment may include:  Having more frequent prenatal exams to check for signs of preeclampsia, if you have an increased risk for preeclampsia.  Bed rest.  Reducing how much salt (sodium) you eat.  Medicine to lower your blood pressure.  Staying in the hospital, if your condition is severe. There, treatment will focus on controlling your blood pressure and the amount of fluids in your body (fluid retention).  You may need to take medicine (magnesium sulfate) to prevent seizures. This medicine may be given as an injection or through an IV tube.  Delivering your baby early, if your condition gets worse. You may have your labor started with medicine (induced), or you may have a cesarean delivery. Follow these instructions at home: Eating and drinking    Drink enough fluid to keep your urine clear or pale yellow.  Eat a healthy diet that is low in sodium. Do not add salt to your food. Check nutrition labels to see how much sodium a food or beverage contains.  Avoid caffeine. Lifestyle   Do not use any products that contain nicotine or tobacco, such as cigarettes and e-cigarettes. If you need help quitting, ask your health care provider.  Do not use alcohol or drugs.  Avoid stress as much as possible. Rest and get plenty of sleep. General instructions   Take over-the-counter and prescription medicines only as told by your health care provider.  When lying down, lie on your side. This keeps pressure off of your baby.  When sitting or lying down, raise (elevate) your feet. Try putting some pillows underneath your lower  legs.  Exercise regularly. Ask your health care provider what kinds of exercise are best for you.  Keep all follow-up and prenatal visits as told by your health care provider. This is important. How is this prevented? To prevent preeclampsia or eclampsia from developing during another pregnancy:  Get proper medical care during pregnancy. Your health care provider may be able to prevent preeclampsia or diagnose and treat it early.  Your health care provider may have you take a low-dose aspirin or a calcium supplement during your next pregnancy.  You may have tests of your blood pressure and kidney function after giving birth.  Maintain a healthy weight. Ask your health  care provider for help managing weight gain during pregnancy.  Work with your health care provider to manage any long-term (chronic) health conditions you have, such as diabetes or kidney problems. Contact a health care provider if:  You gain more weight than expected.  You have headaches.  You have nausea or vomiting.  You have abdominal pain.  You feel dizzy or light-headed. Get help right away if:  You develop sudden or severe swelling anywhere in your body. This usually happens in the legs.  You gain 5 lbs (2.3 kg) or more during one week.  You have severe:  Abdominal pain.  Headaches.  Dizziness.  Vision problems.  Confusion.  Nausea or vomiting.  You have a seizure.  You have trouble moving any part of your body.  You develop numbness in any part of your body.  You have trouble speaking.  You have any abnormal bleeding.  You pass out. This information is not intended to replace advice given to you by your health care provider. Make sure you discuss any questions you have with your health care provider. Document Released: 01/14/2000 Document Revised: 09/14/2015 Document Reviewed: 08/23/2015 Elsevier Interactive Patient Education  2017 Elsevier Inc.  Postpartum Depression and Baby  Blues The postpartum period begins right after the birth of a baby. During this time, there is often a great amount of joy and excitement. It is also a time of many changes in the life of the parents. Regardless of how many times a mother gives birth, each child brings new challenges and dynamics to the family. It is not unusual to have feelings of excitement along with confusing shifts in moods, emotions, and thoughts. All mothers are at risk of developing postpartum depression or the "baby blues." These mood changes can occur right after giving birth, or they may occur many months after giving birth. The baby blues or postpartum depression can be mild or severe. Additionally, postpartum depression can go away rather quickly, or it can be a long-term condition. What are the causes? Raised hormone levels and the rapid drop in those levels are thought to be a main cause of postpartum depression and the baby blues. A number of hormones change during and after pregnancy. Estrogen and progesterone usually decrease right after the delivery of your baby. The levels of thyroid hormone and various cortisol steroids also rapidly drop. Other factors that play a role in these mood changes include major life events and genetics. What increases the risk? If you have any of the following risks for the baby blues or postpartum depression, know what symptoms to watch out for during the postpartum period. Risk factors that may increase the likelihood of getting the baby blues or postpartum depression include:  Having a personal or family history of depression.  Having depression while being pregnant.  Having premenstrual mood issues or mood issues related to oral contraceptives.  Having a lot of life stress.  Having marital conflict.  Lacking a social support network.  Having a baby with special needs.  Having health problems, such as diabetes. What are the signs or symptoms? Symptoms of baby blues  include:  Brief changes in mood, such as going from extreme happiness to sadness.  Decreased concentration.  Difficulty sleeping.  Crying spells, tearfulness.  Irritability.  Anxiety. Symptoms of postpartum depression typically begin within the first month after giving birth. These symptoms include:  Difficulty sleeping or excessive sleepiness.  Marked weight loss.  Agitation.  Feelings of worthlessness.  Lack of interest  in activity or food. Postpartum psychosis is a very serious condition and can be dangerous. Fortunately, it is rare. Displaying any of the following symptoms is cause for immediate medical attention. Symptoms of postpartum psychosis include:  Hallucinations and delusions.  Bizarre or disorganized behavior.  Confusion or disorientation. How is this diagnosed? A diagnosis is made by an evaluation of your symptoms. There are no medical or lab tests that lead to a diagnosis, but there are various questionnaires that a health care provider may use to identify those with the baby blues, postpartum depression, or psychosis. Often, a screening tool called the [place] Postnatal Depression Scale is used to diagnose depression in the postpartum period. How is this treated? The baby blues usually goes away on its own in 1-2 weeks. Social support is often all that is needed. You will be encouraged to get adequate sleep and rest. Occasionally, you may be given medicines to help you sleep. Postpartum depression requires treatment because it can last several months or longer if it is not treated. Treatment may include individual or group therapy, medicine, or both to address any social, physiological, and psychological factors that may play a role in the depression. Regular exercise, a healthy diet, rest, and social support may also be strongly recommended. Postpartum psychosis is more serious and needs treatment right away. Hospitalization is often needed. Follow these  instructions at home:  Get as much rest as you can. Nap when the baby sleeps.  Exercise regularly. Some women find yoga and walking to be beneficial.  Eat a balanced and nourishing diet.  Do little things that you enjoy. Have a cup of tea, take a bubble bath, read your favorite magazine, or listen to your favorite music.  Avoid alcohol.  Ask for help with household chores, cooking, grocery shopping, or running errands as needed. Do not try to do everything.  Talk to people close to you about how you are feeling. Get support from your partner, family members, friends, or other new moms.  Try to stay positive in how you think. Think about the things you are grateful for.  Do not spend a lot of time alone.  Only take over-the-counter or prescription medicine as directed by your health care provider.  Keep all your postpartum appointments.  Let your health care provider know if you have any concerns. Contact a health care provider if: You are having a reaction to or problems with your medicine. Get help right away if:  You have suicidal feelings.  You think you may harm the baby or someone else. This information is not intended to replace advice given to you by your health care provider. Make sure you discuss any questions you have with your health care provider. Document Released: 10/21/2003 Document Revised: 06/24/2015 Document Reviewed: 10/28/2012 Elsevier Interactive Patient Education  2017 [employer].

## 2016-05-17 ENCOUNTER — Telehealth: Payer: Self-pay | Admitting: *Deleted

## 2016-05-17 ENCOUNTER — Inpatient Hospital Stay (HOSPITAL_COMMUNITY): Payer: Medicaid Other

## 2016-05-17 NOTE — Telephone Encounter (Signed)
Called Emilio Math to inform her of appt made for pt to have Nexplanon insertion on 4/25 @ 1020.  She will contact Lenna Gilford (Case Manager w/[facility] [phone]). Transportation will be arranged by [facility] and she will contact pt with all information.

## 2016-05-17 NOTE — Telephone Encounter (Signed)
11:00 am Called and left voicemail on Donnelly Stager - Case Manager- [facility]- # her number [phone] and stated that patient had appointment on 05/24/16 at 1020 at the [facility] at [address] [place], [place], [address],   with a  female provider to have Nexplanon insertion and blood pressure check.  CM left her phone number to call back for any questions. CM also called [facility] in [place] # [phone] ( where patient is currently staying now)  and spoke to Melene Plan the AD there and gave her the patient's information above regarding her appointment follow up on 05/24/16 at 1020 and she stated they would provide transportation for patient to appointment  if [facility] Montes [facility]- case manager) is not able to that day. [facility] has CM phone number if needed.

## 2016-05-18 NOTE — Telephone Encounter (Signed)
Thank you. Received message. We look forward to seeing Ms. Wiederhold in the office. E. Bishop Vanderwerf, DO

## 2016-05-24 ENCOUNTER — Encounter: Payer: Self-pay | Admitting: Family Medicine

## 2016-05-24 ENCOUNTER — Ambulatory Visit (INDEPENDENT_AMBULATORY_CARE_PROVIDER_SITE_OTHER): Payer: Medicaid Other | Admitting: Family Medicine

## 2016-05-24 VITALS — BP 112/70 | HR 82 | Wt 173.8 lb

## 2016-05-24 DIAGNOSIS — O1413 Severe pre-eclampsia, third trimester: Secondary | ICD-10-CM

## 2016-05-24 DIAGNOSIS — Z3049 Encounter for surveillance of other contraceptives: Secondary | ICD-10-CM

## 2016-05-24 DIAGNOSIS — Z30017 Encounter for initial prescription of implantable subdermal contraceptive: Secondary | ICD-10-CM

## 2016-05-24 MED ORDER — ETONOGESTREL 68 MG ~~LOC~~ IMPL
68.0000 mg | DRUG_IMPLANT | Freq: Once | SUBCUTANEOUS | Status: AC
  Administered 2016-05-24: 68 mg via SUBCUTANEOUS

## 2016-05-24 NOTE — Patient Instructions (Signed)
Etonogestrel implant What is this medicine? ETONOGESTREL (et oh noe JES trel) is a contraceptive (birth control) device. It is used to prevent pregnancy. It can be used for up to 3 years. This medicine may be used for other purposes; ask your health care provider or pharmacist if you have questions. COMMON BRAND NAME(S): Implanon, Nexplanon What should I tell my health care provider before I take this medicine? They need to know if you have any of these conditions: -abnormal vaginal bleeding -blood vessel disease or blood clots -cancer of the breast, cervix, or liver -depression -diabetes -gallbladder disease -headaches -heart disease or recent heart attack -high blood pressure -high cholesterol -kidney disease -liver disease -renal disease -seizures -tobacco smoker -an unusual or allergic reaction to etonogestrel, other hormones, anesthetics or antiseptics, medicines, foods, dyes, or preservatives -pregnant or trying to get pregnant -breast-feeding How should I use this medicine? This device is inserted just under the skin on the inner side of your upper arm by a health care professional. Talk to your pediatrician regarding the use of this medicine in children. Special care may be needed. Overdosage: If you think you have taken too much of this medicine contact a poison control center or emergency room at once. NOTE: This medicine is only for you. Do not share this medicine with others. What if I miss a dose? This does not apply. What may interact with this medicine? Do not take this medicine with any of the following medications: -amprenavir -bosentan -fosamprenavir This medicine may also interact with the following medications: -barbiturate medicines for inducing sleep or treating seizures -certain medicines for fungal infections like ketoconazole and itraconazole -grapefruit juice -griseofulvin -medicines to treat seizures like carbamazepine, felbamate, oxcarbazepine,  phenytoin, topiramate -modafinil -phenylbutazone -rifampin -rufinamide -some medicines to treat HIV infection like atazanavir, indinavir, lopinavir, nelfinavir, tipranavir, ritonavir -St. John's wort This list may not describe all possible interactions. Give your health care provider a list of all the medicines, herbs, non-prescription drugs, or dietary supplements you use. Also tell them if you smoke, drink alcohol, or use illegal drugs. Some items may interact with your medicine. What should I watch for while using this medicine? This product does not protect you against HIV infection (AIDS) or other sexually transmitted diseases. You should be able to feel the implant by pressing your fingertips over the skin where it was inserted. Contact your doctor if you cannot feel the implant, and use a non-hormonal birth control method (such as condoms) until your doctor confirms that the implant is in place. If you feel that the implant may have broken or become bent while in your arm, contact your healthcare provider. What side effects may I notice from receiving this medicine? Side effects that you should report to your doctor or health care professional as soon as possible: -allergic reactions like skin rash, itching or hives, swelling of the face, lips, or tongue -breast lumps -changes in emotions or moods -depressed mood -heavy or prolonged menstrual bleeding -pain, irritation, swelling, or bruising at the insertion site -scar at site of insertion -signs of infection at the insertion site such as fever, and skin redness, pain or discharge -signs of pregnancy -signs and symptoms of a blood clot such as breathing problems; changes in vision; chest pain; severe, sudden headache; pain, swelling, warmth in the leg; trouble speaking; sudden numbness or weakness of the face, arm or leg -signs and symptoms of liver injury like dark yellow or brown urine; general ill feeling or flu-like symptoms;  light-colored   stools; loss of appetite; nausea; right upper belly pain; unusually weak or tired; yellowing of the eyes or skin -unusual vaginal bleeding, discharge -signs and symptoms of a stroke like changes in vision; confusion; trouble speaking or understanding; severe headaches; sudden numbness or weakness of the face, arm or leg; trouble walking; dizziness; loss of balance or coordination Side effects that usually do not require medical attention (report to your doctor or health care professional if they continue or are bothersome): -acne -back pain -breast pain -changes in weight -dizziness -general ill feeling or flu-like symptoms -headache -irregular menstrual bleeding -nausea -sore throat -vaginal irritation or inflammation This list may not describe all possible side effects. Call your doctor for medical advice about side effects. You may report side effects to FDA at 1-800-FDA-1088. Where should I keep my medicine? This drug is given in a hospital or clinic and will not be stored at home. NOTE: This sheet is a summary. It may not cover all possible information. If you have questions about this medicine, talk to your doctor, pharmacist, or health care provider.  2018 Elsevier/Gold Standard (2015-08-05 11:19:22)  

## 2016-05-24 NOTE — Progress Notes (Signed)
     GYNECOLOGY CLINIC PROCEDURE NOTE  Mary Ramirez is a 36 y.o. [id number] here for Nexplanon insertion.  Last pap smear was on 12/27/15 and showed LGSIL. Needs colposcopy procedure performed. No other gynecologic concerns.  Nexplanon Insertion Procedure Patient identified, informed consent performed, consent signed.   Patient does understand that irregular bleeding is a very common side effect of this medication. She was advised to have backup contraception for one week after placement. Pregnancy test in clinic today was negative.  Appropriate time out taken.  Patient's left arm was prepped and draped in the usual sterile fashion.. The ruler used to measure and mark insertion area.  Patient was prepped with alcohol swab and then injected with 3 ml of 1% lidocaine.  She was prepped with betadine, Nexplanon removed from packaging,  Device confirmed in needle, then inserted full length of needle and withdrawn per handbook instructions. Nexplanon was able to palpated in the patient's arm; patient palpated the insert herself. There was minimal blood loss.  Patient insertion site covered with guaze and a pressure bandage to reduce any bruising.  The patient tolerated the procedure well and was given post procedure instructions.     Cleda Clarks, DO OB FELLOW Center for [employer], [facility]
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
## 2016-05-24 NOTE — Addendum Note (Signed)
Addended by: Faythe Casa on: 05/24/2016 11:22 AM   Modules accepted: Orders

## 2016-06-22 ENCOUNTER — Encounter: Payer: Self-pay | Admitting: Advanced Practice Midwife

## 2016-06-22 ENCOUNTER — Ambulatory Visit (INDEPENDENT_AMBULATORY_CARE_PROVIDER_SITE_OTHER): Payer: Medicaid Other | Admitting: Obstetrics and Gynecology

## 2016-06-22 ENCOUNTER — Other Ambulatory Visit (HOSPITAL_COMMUNITY)
Admission: RE | Admit: 2016-06-22 | Discharge: 2016-06-22 | Disposition: A | Discharge status: Home / Self Care | Payer: Medicaid Other | Source: Ambulatory Visit | Attending: Obstetrics and Gynecology | Admitting: Obstetrics and Gynecology

## 2016-06-22 VITALS — BP 113/68 | HR 69 | Wt 174.1 lb

## 2016-06-22 DIAGNOSIS — R87612 Low grade squamous intraepithelial lesion on cytologic smear of cervix (LGSIL): Secondary | ICD-10-CM | POA: Insufficient documentation

## 2016-06-22 DIAGNOSIS — Z3202 Encounter for pregnancy test, result negative: Secondary | ICD-10-CM | POA: Diagnosis not present

## 2016-06-22 NOTE — Progress Notes (Signed)
Subjective:     Mary Ramirez is a 36 y.o. female who presents for a postpartum visit. She is 6 weeks postpartum following a spontaneous vaginal delivery. I have fully reviewed the prenatal and intrapartum course. The delivery was at 39 gestational weeks. Outcome: spontaneous vaginal delivery. Anesthesia: epidural. Postpartum course has been uncomplicated. Baby's course has been uncomplicated. Baby is feeding by bottle - Similac Advance. Bleeding thin lochia. Bowel function is normal. Bladder function is normal. Patient is not sexually active. Contraception method is Nexplanon. Postpartum depression screening: negative. Patient is currently taking zoloft for a history of depression. She is doing well and is seeing a Veterinary surgeoncounselor.     Review of Systems Pertinent items are noted in HPI.   Objective:    BP 113/68   Pulse 69   Wt 174 lb 1.6 oz (79 kg)   BMI 30.84 kg/m   General:  alert, cooperative and no distress   Breasts:  inspection negative, no nipple discharge or bleeding, no masses or nodularity palpable  Lungs: clear to auscultation bilaterally  Heart:  regular rate and rhythm  Abdomen: soft, non-tender; bowel sounds normal; no masses,  no organomegaly   Vulva:  normal  Vagina: normal vagina  Cervix:  multiparous appearance  Corpus: normal size, contour, position, consistency, mobility, non-tender  Adnexa:  normal adnexa and no mass, fullness, tenderness  Rectal Exam: Normal rectovaginal exam        Assessment:     Normal postpartum exam. Colposcopy done at today's visit.   Plan:    1. Contraception: Nexplanon 2. Patient is medically cleared to resume all activities of daily living. Patient with GDM and needs to return fasting for 2 hr glucola Continue zoloft and counseling sessions 3. Follow up in: a few days for glucola or as needed.    Patient given informed consent, signed copy in the chart, time out was performed.  Placed in lithotomy position. Cervix viewed with  speculum and colposcope after application of acetic acid.   Colposcopy adequate?  yes Acetowhite lesions? Yes at 11 o'clock Punctation? no Mosaicism?  no Abnormal vasculature?  no Biopsies? 11 o'clock ECC? yes  COMMENTS:  Patient was given post procedure instructions.  She will return in 2 weeks for results.  Catalina AntiguaPeggy Adrion Menz, MD

## 2016-06-22 NOTE — BH Specialist Note (Signed)
Brief f/u with Ms. Mary Ramirez, who says she is currently seeing counselor,  is in stable housing, and has no further concerns today.

## 2016-07-03 ENCOUNTER — Telehealth: Payer: Self-pay | Admitting: *Deleted

## 2016-07-03 NOTE — Telephone Encounter (Signed)
-----   Message from Catalina AntiguaPeggy Constant, MD sent at 06/28/2016  9:20 AM EDT ----- Please inform patient of normal colpo biopsy. Patient needs repeat pap smear in 1 year  Thanks  Peggy

## 2016-07-03 NOTE — Telephone Encounter (Signed)
I called the number listed and left on a voicemail that said pathways that we are trying to reach Deshanae  With some information, please call our office.

## 2016-07-04 NOTE — Telephone Encounter (Signed)
Pt's case worker, Alfredo Martinezakita, called and I informed her of colpo results and the recommendation to come in one year for a pap smear with HPV testing.  Alfredo Martinezakita stated understanding with no further questions.

## 2016-09-18 MED FILL — SERTRALINE HCL 50 MG TABLET: 50 | 30 days supply | Qty: 30 | Fill #1

## 2016-09-18 MED FILL — LEVOTHYROXINE 125 MCG TABLE: 125 | 30 days supply | Qty: 30 | Fill #1

## 2017-03-15 MED FILL — SERTRALINE HCL 50 MG TABLET: 50 | 30 days supply | Qty: 30 | Fill #0

## 2017-03-15 MED FILL — LEVOTHYROXINE 125 MCG TABLE: 125 | 30 days supply | Qty: 30 | Fill #2

## 2017-05-24 ENCOUNTER — Inpatient Hospital Stay: Payer: Self-pay

## 2017-10-31 ENCOUNTER — Ambulatory Visit: Payer: Medicaid Other | Attending: Family Medicine | Admitting: Family Medicine

## 2017-10-31 ENCOUNTER — Encounter: Payer: Self-pay | Admitting: Family Medicine

## 2017-10-31 VITALS — BP 112/73 | HR 79 | Temp 98.2°F | Resp 18 | Ht 63.0 in | Wt 217.0 lb

## 2017-10-31 DIAGNOSIS — F1290 Cannabis use, unspecified, uncomplicated: Secondary | ICD-10-CM | POA: Diagnosis not present

## 2017-10-31 DIAGNOSIS — F17210 Nicotine dependence, cigarettes, uncomplicated: Secondary | ICD-10-CM | POA: Diagnosis not present

## 2017-10-31 DIAGNOSIS — Z833 Family history of diabetes mellitus: Secondary | ICD-10-CM | POA: Diagnosis not present

## 2017-10-31 DIAGNOSIS — Z590 Homelessness: Secondary | ICD-10-CM | POA: Diagnosis not present

## 2017-10-31 DIAGNOSIS — E039 Hypothyroidism, unspecified: Secondary | ICD-10-CM | POA: Diagnosis present

## 2017-10-31 DIAGNOSIS — K219 Gastro-esophageal reflux disease without esophagitis: Secondary | ICD-10-CM

## 2017-10-31 DIAGNOSIS — J309 Allergic rhinitis, unspecified: Secondary | ICD-10-CM | POA: Diagnosis not present

## 2017-10-31 DIAGNOSIS — Z23 Encounter for immunization: Secondary | ICD-10-CM

## 2017-10-31 DIAGNOSIS — Z7984 Long term (current) use of oral hypoglycemic drugs: Secondary | ICD-10-CM | POA: Diagnosis not present

## 2017-10-31 DIAGNOSIS — F53 Postpartum depression: Secondary | ICD-10-CM | POA: Diagnosis not present

## 2017-10-31 DIAGNOSIS — J069 Acute upper respiratory infection, unspecified: Secondary | ICD-10-CM | POA: Diagnosis not present

## 2017-10-31 DIAGNOSIS — E119 Type 2 diabetes mellitus without complications: Secondary | ICD-10-CM

## 2017-10-31 DIAGNOSIS — Z8632 Personal history of gestational diabetes: Secondary | ICD-10-CM

## 2017-10-31 LAB — POCT GLYCOSYLATED HEMOGLOBIN (HGB A1C): HbA1c, POC (controlled diabetic range): 6.6 % (ref 0.0–7.0)

## 2017-10-31 MED ORDER — METFORMIN HCL 500 MG PO TABS: ORAL_TABLET | ORAL | 4 refills | Status: DC

## 2017-10-31 MED ORDER — LANSOPRAZOLE 30 MG PO CPDR: 30.0000 mg | DELAYED_RELEASE_CAPSULE | Freq: Every day | ORAL | 6 refills | Status: DC

## 2017-10-31 MED ORDER — CETIRIZINE HCL 10 MG PO TABS: 10.0000 mg | ORAL_TABLET | Freq: Every day | ORAL | 11 refills | Status: DC

## 2017-10-31 MED ORDER — LEVOTHYROXINE SODIUM 125 MCG PO TABS: 125.0000 ug | ORAL_TABLET | Freq: Every day | ORAL | 0 refills | Status: DC

## 2017-10-31 NOTE — Patient Instructions (Signed)
Heartburn Heartburn is a type of pain or discomfort that can happen in the throat or chest. It is often described as a burning pain. It may also cause a bad taste in the mouth. Heartburn may feel worse when you lie down or bend over. It may be caused by stomach contents that move back up (reflux) into the tube that connects the mouth with the stomach (esophagus). Follow these instructions at home: Take these actions to lessen your discomfort and to help avoid problems. Diet  Follow a diet as told by your doctor. You may need to avoid foods and drinks such as: ? Coffee and tea (with or without caffeine). ? Drinks that contain alcohol. ? Energy drinks and sports drinks. ? Carbonated drinks or sodas. ? Chocolate and cocoa. ? Peppermint and mint flavorings. ? Garlic and onions. ? Horseradish. ? Spicy and acidic foods, such as peppers, chili powder, curry powder, vinegar, hot sauces, and BBQ sauce. ? Citrus fruit juices and citrus fruits, such as oranges, lemons, and limes. ? Tomato-based foods, such as red sauce, chili, salsa, and pizza with red sauce. ? Fried and fatty foods, such as donuts, french fries, potato chips, and high-fat dressings. ? High-fat meats, such as hot dogs, rib eye steak, sausage, ham, and bacon. ? High-fat dairy items, such as whole milk, butter, and cream cheese.  Eat small meals often. Avoid eating large meals.  Avoid drinking large amounts of liquid with your meals.  Avoid eating meals during the 2-3 hours before bedtime.  Avoid lying down right after you eat.  Do not exercise right after you eat. General instructions  Pay attention to any changes in your symptoms.  Take over-the-counter and prescription medicines only as told by your doctor. Do not take aspirin, ibuprofen, or other NSAIDs unless your doctor says it is okay.  Do not use any tobacco products, including cigarettes, chewing tobacco, and e-cigarettes. If you need help quitting, ask your  doctor.  Wear loose clothes. Do not wear anything tight around your waist.  Raise (elevate) the head of your bed about 6 inches (15 cm).  Try to lower your stress. If you need help doing this, ask your doctor.  If you are overweight, lose an amount of weight that is healthy for you. Ask your doctor about a safe weight loss goal.  Keep all follow-up visits as told by your doctor. This is important. Contact a doctor if:  You have new symptoms.  You lose weight and you do not know why it is happening.  You have trouble swallowing, or it hurts to swallow.  You have wheezing or a cough that keeps happening.  Your symptoms do not get better with treatment.  You have heartburn often for more than two weeks. Get help right away if:  You have pain in your arms, neck, jaw, teeth, or back.  You feel sweaty, dizzy, or light-headed.  You have chest pain or shortness of breath.  You throw up (vomit) and your throw up looks like blood or coffee grounds.  Your poop (stool) is bloody or black. This information is not intended to replace advice given to you by your health care provider. Make sure you discuss any questions you have with your health care provider. Document Released: 09/28/2010 Document Revised: 06/24/2015 Document Reviewed: 05/13/2014 Elsevier Interactive Patient Education  2018 Elsevier Inc.  
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
## 2017-11-01 LAB — COMPREHENSIVE METABOLIC PANEL WITH GFR
ALT: 23 IU/L (ref 0–32)
AST: 24 IU/L (ref 0–40)
Albumin/Globulin Ratio: 1.4 (ref 1.2–2.2)
Albumin: 4.6 g/dL (ref 3.5–5.5)
Alkaline Phosphatase: 133 IU/L — ABNORMAL HIGH (ref 39–117)
BUN/Creatinine Ratio: 10 (ref 9–23)
BUN: 8 mg/dL (ref 6–20)
Bilirubin Total: 0.3 mg/dL (ref 0.0–1.2)
CO2: 21 mmol/L (ref 20–29)
Calcium: 9.9 mg/dL (ref 8.7–10.2)
Chloride: 99 mmol/L (ref 96–106)
Creatinine, Ser: 0.81 mg/dL (ref 0.57–1.00)
GFR calc Af Amer: 109 mL/min/1.73
GFR calc non Af Amer: 94 mL/min/1.73
Globulin, Total: 3.3 g/dL (ref 1.5–4.5)
Glucose: 129 mg/dL — ABNORMAL HIGH (ref 65–99)
Potassium: 4.7 mmol/L (ref 3.5–5.2)
Sodium: 136 mmol/L (ref 134–144)
Total Protein: 7.9 g/dL (ref 6.0–8.5)

## 2017-11-01 LAB — LIPID PANEL
Chol/HDL Ratio: 5.4 ratio — ABNORMAL HIGH (ref 0.0–4.4)
Cholesterol, Total: 200 mg/dL — ABNORMAL HIGH (ref 100–199)
HDL: 37 mg/dL — ABNORMAL LOW
LDL Calculated: 119 mg/dL — ABNORMAL HIGH (ref 0–99)
Triglycerides: 218 mg/dL — ABNORMAL HIGH (ref 0–149)
VLDL Cholesterol Cal: 44 mg/dL — ABNORMAL HIGH (ref 5–40)

## 2017-11-01 LAB — TSH: TSH: 20.96 u[IU]/mL — ABNORMAL HIGH (ref 0.450–4.500)

## 2017-11-05 ENCOUNTER — Telehealth: Payer: Self-pay
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
## 2017-11-07 ENCOUNTER — Telehealth: Payer: Self-pay | Admitting: *Deleted

## 2017-11-07 NOTE — Telephone Encounter (Signed)
-----   Message from Cain Saupe, MD sent at 11/01/2017 10:45 PM EDT ----- Please let patient know that her TSH was elevated which is consistent with her being out of medication and she should restart her thyroid medication and take it daily. Her blood sugar was 129 and her alkaline phosphatase was slightly above normal at 133 (normal is 39-177) but this is improved from a level os 258 in April of last year. I would suggest a low dose statin medication such as simvastatin 20 mg due to LDL/bad cholesterol of 119 with slightly low HDL of 37. Her goal LDL is less than 100 and HDL above 40

## 2017-11-07 NOTE — Telephone Encounter (Signed)
Medical Assistant left message on patient's home and cell voicemail. Voicemail states to give a call back to [place] with [facility] at [phone]. !!!Please inform patient of TSH being elevated due to her being out of the medication. Please take medication daily and limit sugar, bread, rice, pasta intake. Patient should start low dose cholesterol medication. Please ask if she is willing.!!!

## 2018-01-31 ENCOUNTER — Encounter: Payer: Self-pay | Admitting: Family Medicine

## 2018-01-31 ENCOUNTER — Ambulatory Visit: Payer: Medicaid Other | Attending: Family Medicine | Admitting: Family Medicine

## 2018-01-31 VITALS — BP 109/73 | HR 73 | Temp 98.2°F | Resp 18 | Ht 63.0 in | Wt 213.0 lb

## 2018-01-31 DIAGNOSIS — E039 Hypothyroidism, unspecified: Secondary | ICD-10-CM | POA: Diagnosis not present

## 2018-01-31 DIAGNOSIS — Z7901 Long term (current) use of anticoagulants: Secondary | ICD-10-CM | POA: Diagnosis not present

## 2018-01-31 DIAGNOSIS — J069 Acute upper respiratory infection, unspecified: Secondary | ICD-10-CM

## 2018-01-31 DIAGNOSIS — E782 Mixed hyperlipidemia: Secondary | ICD-10-CM

## 2018-01-31 DIAGNOSIS — E119 Type 2 diabetes mellitus without complications: Secondary | ICD-10-CM | POA: Diagnosis present

## 2018-01-31 DIAGNOSIS — Z79899 Other long term (current) drug therapy: Secondary | ICD-10-CM | POA: Diagnosis not present

## 2018-01-31 DIAGNOSIS — Z79890 Hormone replacement therapy (postmenopausal): Secondary | ICD-10-CM | POA: Diagnosis not present

## 2018-01-31 DIAGNOSIS — R350 Frequency of micturition: Secondary | ICD-10-CM | POA: Diagnosis not present

## 2018-01-31 DIAGNOSIS — M545 Low back pain, unspecified: Secondary | ICD-10-CM

## 2018-01-31 DIAGNOSIS — Z7984 Long term (current) use of oral hypoglycemic drugs: Secondary | ICD-10-CM | POA: Diagnosis not present

## 2018-01-31 DIAGNOSIS — G8929 Other chronic pain: Secondary | ICD-10-CM

## 2018-01-31 DIAGNOSIS — M79671 Pain in right foot: Secondary | ICD-10-CM

## 2018-01-31 DIAGNOSIS — K219 Gastro-esophageal reflux disease without esophagitis: Secondary | ICD-10-CM | POA: Diagnosis not present

## 2018-01-31 LAB — POCT URINALYSIS DIP (CLINITEK)
Bilirubin, UA: NEGATIVE
Blood, UA: NEGATIVE
Glucose, UA: NEGATIVE mg/dL
Ketones, POC UA: NEGATIVE mg/dL
Leukocytes, UA: NEGATIVE
Nitrite, UA: NEGATIVE
POC PROTEIN,UA: NEGATIVE
Spec Grav, UA: <=1.005 — AB
Urobilinogen, UA: 0.2 U/dL
pH, UA: 5.5

## 2018-01-31 MED ORDER — ROSUVASTATIN CALCIUM 10 MG PO TABS: 10.0000 mg | ORAL_TABLET | Freq: Every day | ORAL | 6 refills | Status: DC

## 2018-01-31 MED ORDER — LEVOTHYROXINE SODIUM 125 MCG PO TABS: 125.0000 ug | ORAL_TABLET | Freq: Every day | ORAL | 0 refills | Status: DC

## 2018-01-31 NOTE — Progress Notes (Signed)
Subjective:    Patient ID: Mary Ramirez, female    DOB: [birth year], 38 y.o.   MRN: [id number]  HPI       38 yo female who is seen in follow-up of chronic medical issues. Patient was last seen in the office on 10/31/17. Patient with Hgb A1c of 6.6 diagnostic of Type 2 Diabetes.  Patient was prescribed metformin 500 mg once daily.  On CMP done at that visit, patient's blood sugar was elevated at 129 and patient with mild increase in alkaline phosphatase at 133.  Patient also had lipid panel done at her last visit which showed a total cholesterol of 200, triglycerides of 219, HDL of 37 and LDL of 119.  Cholesterol medication was suggested which patient declined when notified of her labs.  Patient had also been out of her thyroid medication and TSH was elevated reflective of her lack of medication.  Patient is seen in follow-up of chronic medical issues including type 2 diabetes, hypothyroidism, hyperlipidemia, and GERD.       At today's visit, patient with complaint of some mild increase in nasal congestion and sensation of postnasal drainage.  Patient also has felt as if Mary Ramirez has soreness in her neck on either side in the front of the neck.  Patient wonders if this may be related to her hypothyroidism.  Patient states that Mary Ramirez does feel better since restarting her thyroid medication.  Patient does have some mild fatigue.  Patient states that Mary Ramirez was notified about the high cholesterol/lipid levels however Mary Ramirez states that Mary Ramirez was afraid to take cholesterol medication.  Patient states that Mary Ramirez would be willing to take a low dose of a cholesterol medication.  Patient does have family history of her father having had a stroke.  Patient denies any current acid reflux symptoms.  Patient has felt as if Mary Ramirez is having some increased urinary frequency as well as continued chronic issues with low back pain.  Patient also states that Mary Ramirez has been having some chronic issues with pain at the base of her right great toe.   Patient states that Mary Ramirez has increased pain with walking.  Pain is occasionally sharp with the first few steps.  Patient states that Mary Ramirez used to be homeless and at that time Mary Ramirez did walk a lot and Mary Ramirez believes that this foot pain may be related.  Patient denies any numbness or tingling in her feet related to her diabetes.  Patient states that Mary Ramirez has not taken the metformin on a regular basis.  Patient states that Mary Ramirez was also afraid to take metformin.  Patient states that Mary Ramirez looks up things online and sometimes Mary Ramirez is afraid to take medications because of what Mary Ramirez reads online.  Patient reports that Mary Ramirez is trying to stop smoking but believes Mary Ramirez is having some nicotine withdrawal symptoms.  Patient states that online, YouTube, Mary Ramirez saw that nicotine withdrawal was not a real issue but all in your head but Mary Ramirez believes that Mary Ramirez is having symptoms related to nicotine withdrawal.    Review of Systems  Constitutional: Positive for fatigue. Negative for chills and fever.  HENT: Positive for congestion and postnasal drip. Negative for rhinorrhea, sore throat and trouble swallowing.   Respiratory: Negative for cough and shortness of breath.   Cardiovascular: Negative for chest pain, palpitations and leg swelling.  Gastrointestinal: Negative for abdominal pain, constipation, diarrhea and nausea.  Genitourinary: Positive for frequency. Negative for dysuria.  Musculoskeletal: Positive for arthralgias, back pain and gait problem.  Negative for joint swelling.  Neurological: Negative for dizziness and headaches.  Hematological: Negative for adenopathy. Does not bruise/bleed easily.       Objective:   Physical Exam Constitutional:      General: Mary Ramirez is not in acute distress.    Appearance: Normal appearance. Mary Ramirez is obese.  HENT:     Head: Normocephalic and atraumatic.     Nose: Congestion and rhinorrhea present.     Mouth/Throat:     Mouth: Mucous membranes are moist.     Pharynx: Posterior oropharyngeal  erythema present. No oropharyngeal exudate.  Eyes:     Extraocular Movements: Extraocular movements intact.     Conjunctiva/sclera: Conjunctivae normal.  Neck:     Musculoskeletal: Normal range of motion and neck supple. Muscular tenderness (patient with complaint of sensation odf tenderness on eboth sides of her neck below the chin) present. No neck rigidity.     Vascular: No carotid bruit.  Cardiovascular:     Rate and Rhythm: Normal rate and regular rhythm.     Pulses:          Dorsalis pedis pulses are 1+ on the right side and 1+ on the left side.       Posterior tibial pulses are 1+ on the right side and 1+ on the left side.  Pulmonary:     Effort: Pulmonary effort is normal.     Breath sounds: Normal breath sounds. No wheezing.  Abdominal:     General: Bowel sounds are normal.     Palpations: Abdomen is soft.     Tenderness: There is abdominal tenderness.     Comments: Truncal obesity  Musculoskeletal:        General: Tenderness (base of right great toe at MTP joint) present.     Right lower leg: No edema.     Left lower leg: No edema.     Right foot: Normal range of motion. Prominent metatarsal heads (right foot with tenderness at the MTP joint of the great toe) present. No deformity, bunion, Charcot foot or foot drop.     Left foot: Normal range of motion. No deformity, bunion, Charcot foot, foot drop or prominent metatarsal heads.  Feet:     Right foot:     Protective Sensation: 10 sites tested. 9 sites sensed.     Skin integrity: Callus present. No ulcer, blister, skin breakdown, erythema, warmth, dry skin or fissure.     Toenail Condition: Right toenails are normal.     Left foot:     Protective Sensation: 10 sites tested. 9 sites sensed.     Skin integrity: Callus present. No ulcer, blister, skin breakdown, erythema, warmth, dry skin or fissure.     Toenail Condition: Left toenails are normal.  Lymphadenopathy:     Cervical: No cervical adenopathy.  Skin:    General:  Skin is warm and dry.     Comments: Wart on the lateral side of distal right LE just above the ankle on on left foot near midline; callus present on the dorsum of the heels bilaterally  Neurological:     Mental Status: Mary Ramirez is alert.         Assessment & Plan:  1. Type 2 diabetes mellitus without complication, without long-term current use of insulin (HCC) Patient had hemoglobin A1c of 6.6 at her last visit consistent with type 2 diabetes.  Patient was prescribed metformin but states that Mary Ramirez took the medication infrequently as Mary Ramirez was afraid to take medication.  Patient  will have CMP, hemoglobin A1c and urine microalbumin creatinine ratio done at today's visit.  Patient will also be referred to podiatry regarding foot pain and diabetic foot care discussed as patient with calluses on her feet and had absent monofilament sensation on both heels. - Comprehensive metabolic panel - Hemoglobin A1c - Microalbumin/Creatinine Ratio, Urine - POCT URINALYSIS DIP ([facility]) - Ambulatory referral to Podiatry  2. Hypothyroidism, unspecified type Patient will have TSH in follow-up of hypothyroidism and will be notified of the changes needed in the dose of her thyroid medication based on today's labs. - TSH  3. Gastroesophageal reflux disease, esophagitis presence not specified Patient continues to take lansoprazole and patient denies any reflux symptoms at today's visit.  Patient should continue to avoid late night eating as well as avoidance of known trigger foods  4. Mixed hyperlipidemia Discussed with the patient that her recent lipid panel showed high LDL and total cholesterol, low HDL and elevated triglycerides.  Patient also reports a family history of father having had a stroke.  Importance of controlling cholesterol was discussed.  Prescription provided for Crestor 10 mg to start once daily and patient given handout as part of AVS on cholesterol.  Low-fat diet and continued exercise encouraged.  Patient congratulated on smoking cessation - rosuvastatin (CRESTOR) 10 MG tablet; Take 1 tablet (10 mg total) by mouth daily. To lower cholesterol  Dispense: 30 tablet; Refill: 6  5. Encounter for long-term (current) use of medications Patient will have CMP at today's visit in follow-up of long-term use of her medications as patient also had mild increase in AST at her last visit along with elevated blood sugar - Comprehensive metabolic panel  6. Chronic midline low back pain without sciatica Patient with chronic low back pain.  Discussed the need for weight loss and continued physical activity.  Patient may take over-the-counter medications as needed for pain  7. Urinary frequency Patient with complaint of urinary frequency and patient will have urinalysis done at today's visit and follow-up to look for possible urinary tract infection but will also have CMP and hemoglobin A1c in follow-up of her diabetes to see if this may be a contributing factor - POCT URINALYSIS DIP ([facility])  8. Right foot pain Patient with complaint of pain at the base of the right great toe and patient is being referred to podiatry for further evaluation and treatment - Ambulatory referral to Podiatry  9. Upper respiratory tract infection, unspecified type Patient with increased nasal congestion and postnasal drainage.  Patient with URI versus allergic rhinitis type symptoms.  Patient was previously prescribed cetirizine but Mary Ramirez states that Mary Ramirez has not been taking this regularly but patient will restart to see if this helps with her current congestion.  An After Visit Summary was printed and given to the patient.  Allergies as of 01/31/2018   No Known Allergies     Medication List       Accurate as of January 31, 2018 10:50 AM. Always use your most recent med list.        cetirizine 10 MG tablet Commonly known as:  ZYRTEC Take 1 tablet (10 mg total) by mouth daily. At bedtime to help with congestion     ibuprofen 600 MG tablet Commonly known as:  ADVIL,MOTRIN Take 1 tablet (600 mg total) by mouth every 6 (six) hours.   lansoprazole 30 MG capsule Commonly known as:  PREVACID Take 1 capsule (30 mg total) by mouth daily at 12 noon. To reduce stomach acid  levothyroxine 125 MCG tablet Commonly known as:  SYNTHROID, LEVOTHROID Take 1 tablet (125 mcg total) by mouth daily before breakfast.   metFORMIN 500 MG tablet Commonly known as:  GLUCOPHAGE Once daily after evening meal   rosuvastatin 10 MG tablet Commonly known as:  CRESTOR Take 1 tablet (10 mg total) by mouth daily. To lower cholesterol   sertraline 50 MG tablet Commonly known as:  ZOLOFT Take 1 tablet (50 mg total) by mouth daily.       Return in about 4 months (around 06/01/2018) for DM/lipids and f/u as needed.

## 2018-01-31 NOTE — Patient Instructions (Signed)
Cholesterol    Cholesterol is a fat. Your body needs a small amount of cholesterol. Cholesterol (plaque) may build up in your blood vessels (arteries). That makes you more likely to have a heart attack or stroke.  You cannot feel your cholesterol level. Having a blood test is the only way to find out if your level is high. Keep your test results. Work with your doctor to keep your cholesterol at a good level.  What do the results mean?  Total cholesterol is how much cholesterol is in your blood.  LDL is bad cholesterol. This is the type that can build up. Try to have low LDL.  HDL is good cholesterol. It cleans your blood vessels and carries LDL away. Try to have high HDL.  Triglycerides are fat that the body can store or burn for energy.  What are good levels of cholesterol?  Total cholesterol below 200.  LDL below 100 is good for people who have health risks. LDL below 70 is good for people who have very high risks.  HDL above 40 is good. It is best to have HDL of 60 or higher.  Triglycerides below 150.  How can I lower my cholesterol?  Diet    Follow your diet program as told by your doctor.  Choose fish, white meat chicken, or turkey that is roasted or baked. Try not to eat red meat, fried foods, sausage, or lunch meats.  Eat lots of fresh fruits and vegetables.  Choose whole grains, beans, pasta, potatoes, and cereals.  Choose olive oil, corn oil, or canola oil. Only use small amounts.  Try not to eat butter, mayonnaise, shortening, or palm kernel oils.  Try not to eat foods with trans fats.  Choose low-fat or nonfat dairy foods.  Drink skim or nonfat milk.  Eat low-fat or nonfat yogurt and cheeses.  Try not to drink whole milk or cream.  Try not to eat ice cream, egg yolks, or full-fat cheeses.  Healthy desserts include angel food cake, ginger snaps, animal crackers, hard candy, popsicles, and low-fat or nonfat frozen yogurt. Try not to eat pastries, cakes, pies, and cookies.     Exercise  Follow your  exercise program as told by your doctor.  Be more active. Try gardening, walking, and taking the stairs.  Ask your doctor about ways that you can be more active.  Medicine  Take over-the-counter and prescription medicines only as told by your doctor.  This information is not intended to replace advice given to you by your health care provider. Make sure you discuss any questions you have with your health care provider.  Document Released: 04/14/2008 Document Revised: 08/18/2015 Document Reviewed: 07/29/2015  Elsevier Interactive Patient Education  2019 [employer]

## 2018-02-01 ENCOUNTER — Telehealth: Payer: Self-pay | Admitting: *Deleted

## 2018-02-01 LAB — COMPREHENSIVE METABOLIC PANEL WITH GFR
ALT: 19 IU/L (ref 0–32)
AST: 17 IU/L (ref 0–40)
Albumin/Globulin Ratio: 1.4 (ref 1.2–2.2)
Albumin: 4.6 g/dL (ref 3.5–5.5)
Alkaline Phosphatase: 131 IU/L — ABNORMAL HIGH (ref 39–117)
BUN/Creatinine Ratio: 12 (ref 9–23)
BUN: 10 mg/dL (ref 6–20)
Bilirubin Total: 0.3 mg/dL (ref 0.0–1.2)
CO2: 19 mmol/L — ABNORMAL LOW (ref 20–29)
Calcium: 9.5 mg/dL (ref 8.7–10.2)
Chloride: 101 mmol/L (ref 96–106)
Creatinine, Ser: 0.85 mg/dL (ref 0.57–1.00)
GFR calc Af Amer: 102 mL/min/1.73
GFR calc non Af Amer: 88 mL/min/1.73
Globulin, Total: 3.2 g/dL (ref 1.5–4.5)
Glucose: 122 mg/dL — ABNORMAL HIGH (ref 65–99)
Potassium: 4.4 mmol/L (ref 3.5–5.2)
Sodium: 138 mmol/L (ref 134–144)
Total Protein: 7.8 g/dL (ref 6.0–8.5)

## 2018-02-01 LAB — HEMOGLOBIN A1C
Est. average glucose Bld gHb Est-mCnc: 148 mg/dL
Hgb A1c MFr Bld: 6.8 % — ABNORMAL HIGH (ref 4.8–5.6)

## 2018-02-01 LAB — TSH: TSH: 1.42 u[IU]/mL (ref 0.450–4.500)

## 2018-02-01 LAB — MICROALBUMIN / CREATININE URINE RATIO
Creatinine, Urine: 36.3 mg/dL
Microalb/Creat Ratio: <8.3 mg/g{creat} (ref 0.0–30.0)
Microalbumin, Urine: <3 ug/mL

## 2018-02-01 NOTE — Telephone Encounter (Signed)
-----   Message from Cain Saupe, MD sent at 02/01/2018  1:44 PM EST ----- Notify patient of normal urine micro-albumin test

## 2018-02-01 NOTE — Telephone Encounter (Signed)
Medical Assistant left message on patient's home and cell voicemail. Voicemail states to give a call back to Anela Bensman with [facility] at [phone].  

## 2018-02-14 ENCOUNTER — Ambulatory Visit (INDEPENDENT_AMBULATORY_CARE_PROVIDER_SITE_OTHER): Payer: Medicaid Other

## 2018-02-14 ENCOUNTER — Other Ambulatory Visit: Payer: Self-pay | Admitting: Podiatry

## 2018-02-14 ENCOUNTER — Ambulatory Visit: Payer: Medicaid Other | Admitting: Podiatry

## 2018-02-14 DIAGNOSIS — M7751 Other enthesopathy of right foot: Secondary | ICD-10-CM | POA: Diagnosis not present

## 2018-02-14 DIAGNOSIS — M779 Enthesopathy, unspecified: Secondary | ICD-10-CM

## 2018-02-14 DIAGNOSIS — M79671 Pain in right foot: Secondary | ICD-10-CM

## 2018-02-14 DIAGNOSIS — M7752 Other enthesopathy of left foot: Secondary | ICD-10-CM

## 2018-02-14 DIAGNOSIS — M25572 Pain in left ankle and joints of left foot: Principal | ICD-10-CM

## 2018-02-14 DIAGNOSIS — M25571 Pain in right ankle and joints of right foot: Secondary | ICD-10-CM

## 2018-02-14 DIAGNOSIS — M7989 Other specified soft tissue disorders: Secondary | ICD-10-CM

## 2018-02-14 DIAGNOSIS — M7750 Other enthesopathy of unspecified foot: Secondary | ICD-10-CM

## 2018-02-14 MED ORDER — TRIAMCINOLONE ACETONIDE 0.025 % EX OINT: 1.0000 "application " | TOPICAL_OINTMENT | Freq: Two times a day (BID) | CUTANEOUS | 0 refills | Status: DC

## 2018-02-14 MED ORDER — DICLOFENAC SODIUM 1 % TD GEL: 2.0000 g | Freq: Four times a day (QID) | TRANSDERMAL | 2 refills | Status: DC
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
## 2018-02-28 ENCOUNTER — Ambulatory Visit: Payer: Medicaid Other | Admitting: Podiatry

## 2018-04-03 ENCOUNTER — Other Ambulatory Visit: Payer: Self-pay | Admitting: Family Medicine

## 2018-04-03 DIAGNOSIS — E119 Type 2 diabetes mellitus without complications: Secondary | ICD-10-CM

## 2018-05-09 ENCOUNTER — Other Ambulatory Visit: Payer: Self-pay | Admitting: Family Medicine

## 2018-05-09 DIAGNOSIS — E039 Hypothyroidism, unspecified: Secondary | ICD-10-CM

## 2018-05-27 ENCOUNTER — Ambulatory Visit: Payer: Self-pay | Admitting: Family Medicine

## 2018-06-09 ENCOUNTER — Other Ambulatory Visit: Payer: Self-pay | Admitting: Family Medicine

## 2018-06-09 DIAGNOSIS — E039 Hypothyroidism, unspecified: Secondary | ICD-10-CM

## 2018-06-09 DIAGNOSIS — K219 Gastro-esophageal reflux disease without esophagitis: Secondary | ICD-10-CM

## 2018-07-08 ENCOUNTER — Other Ambulatory Visit: Payer: Self-pay | Admitting: Family Medicine

## 2018-07-08 DIAGNOSIS — E782 Mixed hyperlipidemia: Secondary | ICD-10-CM

## 2018-07-08 DIAGNOSIS — K219 Gastro-esophageal reflux disease without esophagitis: Secondary | ICD-10-CM

## 2018-07-08 DIAGNOSIS — E119 Type 2 diabetes mellitus without complications: Secondary | ICD-10-CM

## 2018-10-23 ENCOUNTER — Other Ambulatory Visit: Payer: Self-pay | Admitting: Family Medicine

## 2018-10-23 ENCOUNTER — Other Ambulatory Visit: Payer: Self-pay | Admitting: Podiatry

## 2018-10-23 DIAGNOSIS — K219 Gastro-esophageal reflux disease without esophagitis: Secondary | ICD-10-CM

## 2018-10-23 DIAGNOSIS — E039 Hypothyroidism, unspecified: Secondary | ICD-10-CM

## 2018-10-23 DIAGNOSIS — E782 Mixed hyperlipidemia: Secondary | ICD-10-CM

## 2018-10-23 DIAGNOSIS — E119 Type 2 diabetes mellitus without complications: Secondary | ICD-10-CM

## 2018-11-04 ENCOUNTER — Other Ambulatory Visit: Payer: Self-pay | Admitting: Family Medicine

## 2018-11-04 DIAGNOSIS — K219 Gastro-esophageal reflux disease without esophagitis: Secondary | ICD-10-CM

## 2018-11-04 DIAGNOSIS — J309 Allergic rhinitis, unspecified: Secondary | ICD-10-CM

## 2018-11-04 DIAGNOSIS — E782 Mixed hyperlipidemia: Secondary | ICD-10-CM

## 2018-11-04 DIAGNOSIS — E039 Hypothyroidism, unspecified: Secondary | ICD-10-CM

## 2018-11-04 DIAGNOSIS — E119 Type 2 diabetes mellitus without complications: Secondary | ICD-10-CM

## 2018-11-14 ENCOUNTER — Encounter: Payer: Self-pay | Admitting: Family Medicine

## 2018-11-14 ENCOUNTER — Other Ambulatory Visit: Payer: Self-pay

## 2018-11-14 ENCOUNTER — Ambulatory Visit: Payer: Medicaid Other | Attending: Family Medicine | Admitting: Family Medicine

## 2018-11-14 VITALS — BP 132/87 | HR 74 | Temp 97.9°F | Ht 63.0 in

## 2018-11-14 DIAGNOSIS — E782 Mixed hyperlipidemia: Secondary | ICD-10-CM | POA: Diagnosis not present

## 2018-11-14 DIAGNOSIS — E119 Type 2 diabetes mellitus without complications: Secondary | ICD-10-CM | POA: Diagnosis not present

## 2018-11-14 DIAGNOSIS — M62830 Muscle spasm of back: Secondary | ICD-10-CM

## 2018-11-14 DIAGNOSIS — R82998 Other abnormal findings in urine: Secondary | ICD-10-CM

## 2018-11-14 DIAGNOSIS — M545 Low back pain, unspecified: Secondary | ICD-10-CM

## 2018-11-14 DIAGNOSIS — E039 Hypothyroidism, unspecified: Secondary | ICD-10-CM | POA: Diagnosis not present

## 2018-11-14 DIAGNOSIS — J309 Allergic rhinitis, unspecified: Secondary | ICD-10-CM

## 2018-11-14 DIAGNOSIS — F411 Generalized anxiety disorder: Secondary | ICD-10-CM

## 2018-11-14 DIAGNOSIS — K219 Gastro-esophageal reflux disease without esophagitis: Secondary | ICD-10-CM

## 2018-11-14 DIAGNOSIS — Z599 Problem related to housing and economic circumstances, unspecified: Secondary | ICD-10-CM

## 2018-11-14 DIAGNOSIS — Z598 Other problems related to housing and economic circumstances: Secondary | ICD-10-CM

## 2018-11-14 DIAGNOSIS — Z79899 Other long term (current) drug therapy: Secondary | ICD-10-CM

## 2018-11-14 LAB — POCT URINALYSIS DIP (CLINITEK)
Bilirubin, UA: NEGATIVE
Blood, UA: NEGATIVE
Glucose, UA: NEGATIVE mg/dL
Ketones, POC UA: NEGATIVE mg/dL
Nitrite, UA: NEGATIVE
POC PROTEIN,UA: NEGATIVE
Spec Grav, UA: <=1.005 — AB
Urobilinogen, UA: 0.2 U/dL
pH, UA: 5.5

## 2018-11-14 MED ORDER — LANSOPRAZOLE 30 MG PO CPDR: DELAYED_RELEASE_CAPSULE | ORAL | 3 refills | Status: DC

## 2018-11-14 MED ORDER — LEVOTHYROXINE SODIUM 125 MCG PO TABS: ORAL_TABLET | ORAL | 1 refills | Status: DC

## 2018-11-14 MED ORDER — METHOCARBAMOL 500 MG PO TABS: 500.0000 mg | ORAL_TABLET | Freq: Three times a day (TID) | ORAL | 2 refills | Status: DC | PRN

## 2018-11-14 MED ORDER — CETIRIZINE HCL 10 MG PO TABS: 10.0000 mg | ORAL_TABLET | Freq: Every day | ORAL | 11 refills | Status: DC

## 2018-11-14 MED ORDER — TRIAMCINOLONE ACETONIDE 0.025 % EX OINT: 1.0000 "application " | TOPICAL_OINTMENT | Freq: Two times a day (BID) | CUTANEOUS | 0 refills | Status: DC

## 2018-11-14 MED ORDER — SERTRALINE HCL 50 MG PO TABS: 50.0000 mg | ORAL_TABLET | Freq: Every day | ORAL | 1 refills | Status: DC

## 2018-11-14 MED ORDER — IBUPROFEN 600 MG PO TABS: 600.0000 mg | ORAL_TABLET | Freq: Three times a day (TID) | ORAL | 4 refills | Status: DC | PRN

## 2018-11-14 MED ORDER — SULFAMETHOXAZOLE-TRIMETHOPRIM 800-160 MG PO TABS: 1.0000 | ORAL_TABLET | Freq: Two times a day (BID) | ORAL | 0 refills | Status: DC

## 2018-11-14 MED ORDER — METFORMIN HCL 500 MG PO TABS: ORAL_TABLET | ORAL | 3 refills | Status: DC

## 2018-11-14 MED ORDER — ROSUVASTATIN CALCIUM 10 MG PO TABS: ORAL_TABLET | ORAL | 3 refills | Status: DC

## 2018-11-15 LAB — CBC WITH DIFFERENTIAL/PLATELET
Basophils Absolute: 0 x10E3/uL (ref 0.0–0.2)
Basos: 0 %
EOS (ABSOLUTE): 0.2 x10E3/uL (ref 0.0–0.4)
Eos: 2 %
Hematocrit: 38.8 % (ref 34.0–46.6)
Hemoglobin: 12.3 g/dL (ref 11.1–15.9)
Immature Grans (Abs): 0 x10E3/uL (ref 0.0–0.1)
Immature Granulocytes: 0 %
Lymphocytes Absolute: 2.8 x10E3/uL (ref 0.7–3.1)
Lymphs: 29 %
MCH: 26.6 pg (ref 26.6–33.0)
MCHC: 31.7 g/dL (ref 31.5–35.7)
MCV: 84 fL (ref 79–97)
Monocytes Absolute: 0.6 x10E3/uL (ref 0.1–0.9)
Monocytes: 7 %
Neutrophils Absolute: 5.9 x10E3/uL (ref 1.4–7.0)
Neutrophils: 62 %
Platelets: 327 x10E3/uL (ref 150–450)
RBC: 4.62 x10E6/uL (ref 3.77–5.28)
RDW: 13.9 % (ref 11.7–15.4)
WBC: 9.6 x10E3/uL (ref 3.4–10.8)

## 2018-11-15 LAB — COMPREHENSIVE METABOLIC PANEL WITH GFR
ALT: 20 IU/L (ref 0–32)
AST: 20 IU/L (ref 0–40)
Albumin/Globulin Ratio: 1.4 (ref 1.2–2.2)
Albumin: 4.7 g/dL (ref 3.8–4.8)
Alkaline Phosphatase: 147 IU/L — ABNORMAL HIGH (ref 39–117)
BUN/Creatinine Ratio: 13 (ref 9–23)
BUN: 10 mg/dL (ref 6–20)
Bilirubin Total: <0.2 mg/dL (ref 0.0–1.2)
CO2: 20 mmol/L (ref 20–29)
Calcium: 10.2 mg/dL (ref 8.7–10.2)
Chloride: 101 mmol/L (ref 96–106)
Creatinine, Ser: 0.8 mg/dL (ref 0.57–1.00)
GFR calc Af Amer: 110 mL/min/1.73
GFR calc non Af Amer: 95 mL/min/1.73
Globulin, Total: 3.3 g/dL (ref 1.5–4.5)
Glucose: 181 mg/dL — ABNORMAL HIGH (ref 65–99)
Potassium: 4.7 mmol/L (ref 3.5–5.2)
Sodium: 138 mmol/L (ref 134–144)
Total Protein: 8 g/dL (ref 6.0–8.5)

## 2018-11-15 LAB — LIPID PANEL
Chol/HDL Ratio: 3.1 ratio (ref 0.0–4.4)
Cholesterol, Total: 129 mg/dL (ref 100–199)
HDL: 41 mg/dL
LDL Chol Calc (NIH): 55 mg/dL (ref 0–99)
Triglycerides: 203 mg/dL — ABNORMAL HIGH (ref 0–149)
VLDL Cholesterol Cal: 33 mg/dL (ref 5–40)

## 2018-11-15 LAB — HEMOGLOBIN A1C
Est. average glucose Bld gHb Est-mCnc: 180 mg/dL
Hgb A1c MFr Bld: 7.9 % — ABNORMAL HIGH (ref 4.8–5.6)

## 2018-11-15 LAB — TSH: TSH: 2.24 u[IU]/mL (ref 0.450–4.500)

## 2018-11-16 LAB — URINE CULTURE

## 2018-11-24 ENCOUNTER — Encounter: Payer: Self-pay | Admitting: Family Medicine
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
## 2018-12-05 ENCOUNTER — Other Ambulatory Visit: Payer: Self-pay

## 2018-12-05 ENCOUNTER — Ambulatory Visit: Payer: Medicaid Other | Attending: Family Medicine | Admitting: Licensed Clinical Social Worker

## 2018-12-05 DIAGNOSIS — Z598 Other problems related to housing and economic circumstances: Secondary | ICD-10-CM

## 2018-12-05 DIAGNOSIS — Z599 Problem related to housing and economic circumstances, unspecified: Secondary | ICD-10-CM

## 2018-12-05 DIAGNOSIS — F439 Reaction to severe stress, unspecified: Secondary | ICD-10-CM | POA: Diagnosis not present

## 2018-12-13 NOTE — BH Specialist Note (Signed)
Integrated Behavioral Health Visit via Telemedicine (Telephone)  12/05/2018 Haskell Flirt [id number]   Session Start time: 1:40 PM  Session End time: 2:15 PM Total time: 35   Referring Provider: Dr. Chapman Fitch Type of Visit: Telephonic Patient location: Home [facility] Provider location: Office All persons participating in visit: Pt and LCSW  Confirmed patient's address: Yes  Confirmed patient's phone number: Yes  Any changes to demographics: No   Confirmed patient's insurance: Yes  Any changes to patient's insurance: No   Discussed confidentiality: Yes    The following statements were read to the patient and/or legal guardian that are established with the [facility] Provider.  "The purpose of this phone visit is to provide behavioral health care while limiting exposure to the coronavirus (COVID19).  There is a possibility of technology failure and discussed alternative modes of communication if that failure occurs."  "By engaging in this telephone visit, you consent to the provision of healthcare.  Additionally, you authorize for your insurance to be billed for the services provided during this telephone visit."   Patient and/or legal guardian consented to telephone visit: Yes   PRESENTING CONCERNS: Patient and/or family reports the following symptoms/concerns: Pt reports limited support and financial stressors. Reports need with items for minor child, preferably for Christmas Duration of problem: Ongoing; Severity of problem: na  STRENGTHS (Protective Factors/Coping Skills): Pt participates in a program to maintain independent housing Pt has stable housing and receives [employer] benefits Pt receives behavioral health services through [facility] the [place]  GOALS ADDRESSED: Patient will: 1.  Reduce symptoms of: stress  2.  Increase knowledge and/or ability of: self-management skills  3.  Demonstrate ability to: Increase adequate support systems for  patient/family  INTERVENTIONS: Interventions utilized:  Solution-Focused Strategies and Supportive Counseling Standardized Assessments completed: Not Needed  ASSESSMENT: Patient currently experiencing psychosocial stressors. Reports difficulty affording clothing items for minor son, in addition, to food and soap. She receives behavioral health services through [facility].   Patient may benefit from linkage to community resources. She is knowledgeable of local food pantries. LCSW informed pt to community resources to assist with basic needs, food, and additional support. Pt will be notified of any additional assistance provided for Christmas for minor child.   PLAN: 1. Follow up with behavioral health clinician on : Contact LCSW with any additional resource needs 2. Behavioral recommendations: Continue services through [facility] and utilize resources provided 3. Referral(s): [place] (In Clinic) and [employer] Resources:  Food and [place] Markala Sitts, [place] 12/13/2018 11:00 AM

## 2019-01-28 IMAGING — US US MFM FETAL BPP W/O NON-STRESS
1 series · 14 of 28 positions shown · non-contrast
Comparison: none

[Series 1: us mfm fetal bpp w/o non-stress · 58 acquisitions, 14 frames shown]
[im 3/58]
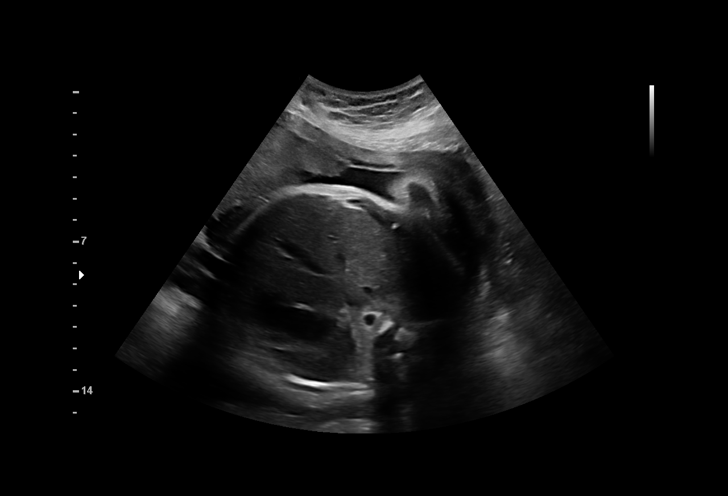
[im 7/58]
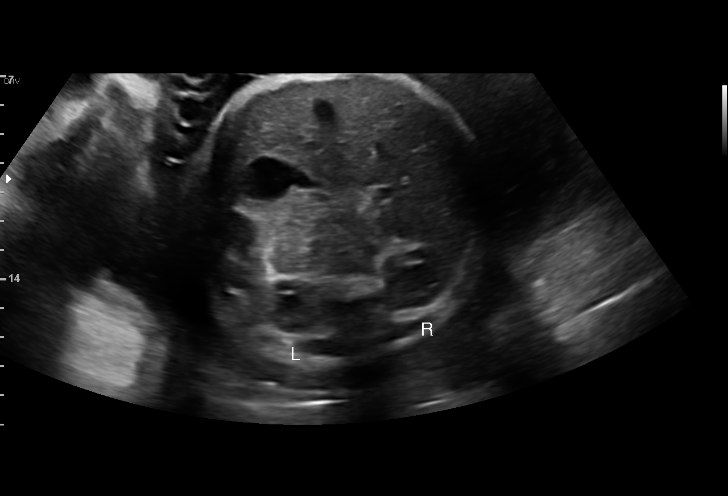
[im 11/58]
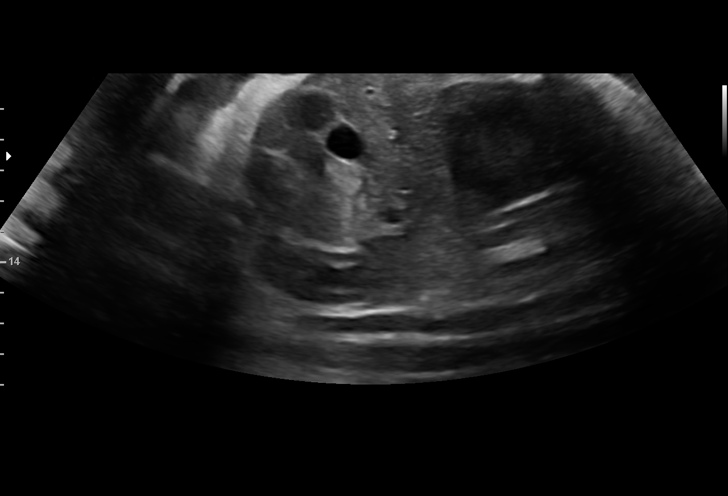
[im 15/58]
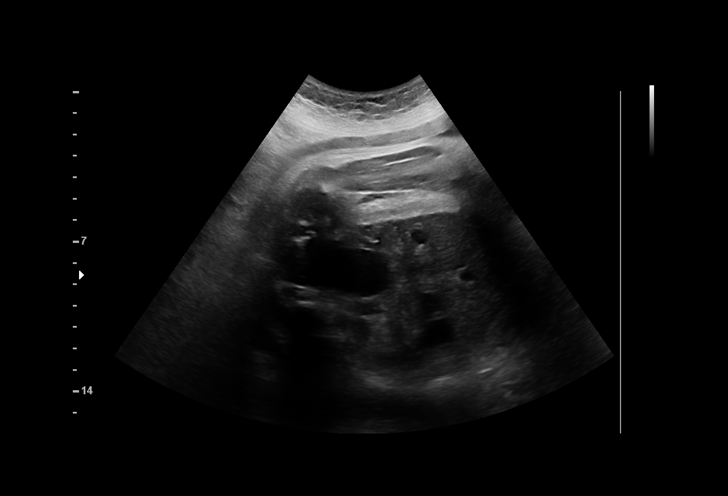
[im 20/58]
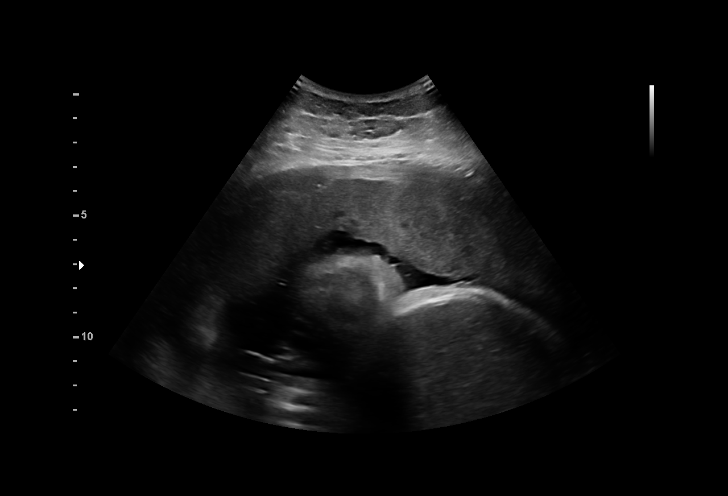
[im 24/58]
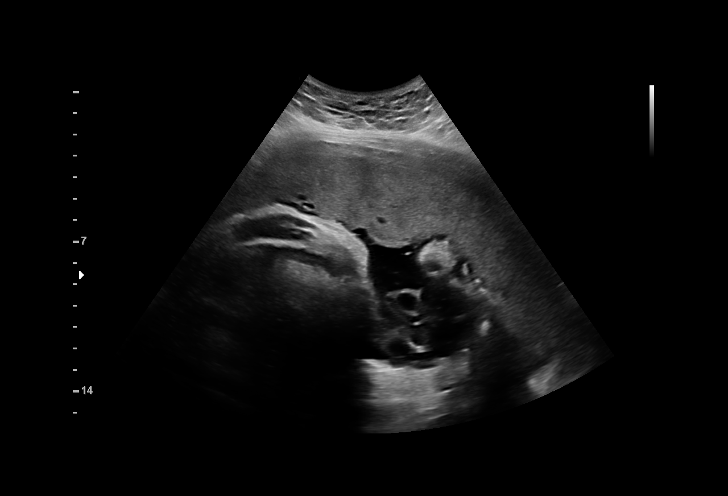
[im 28/58]
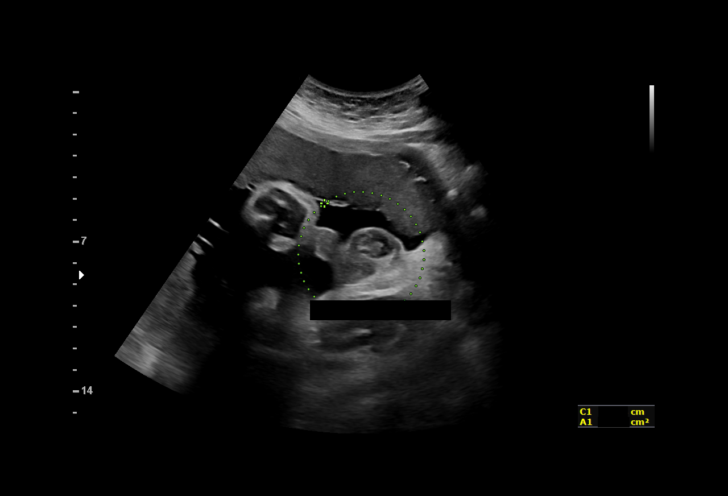
[im 32/58]
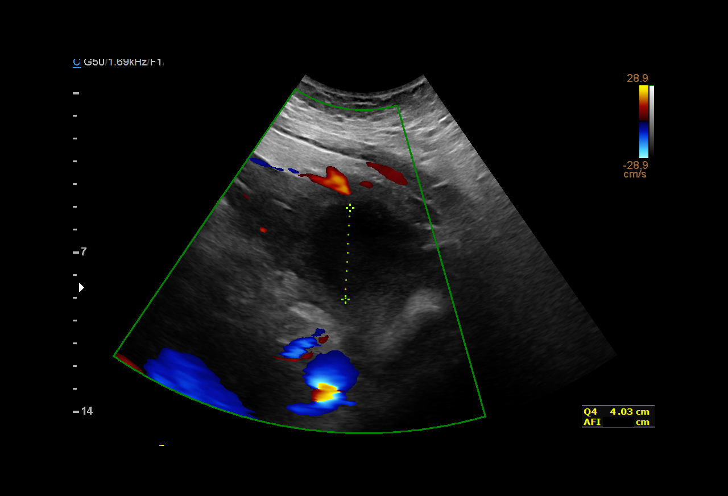
[im 36/58]
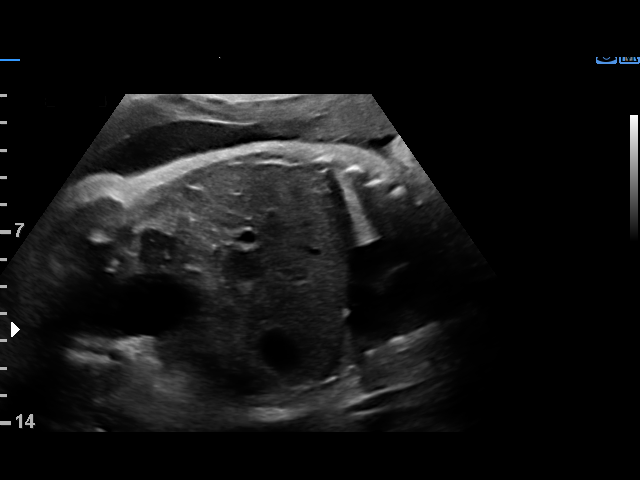
[im 41/58]
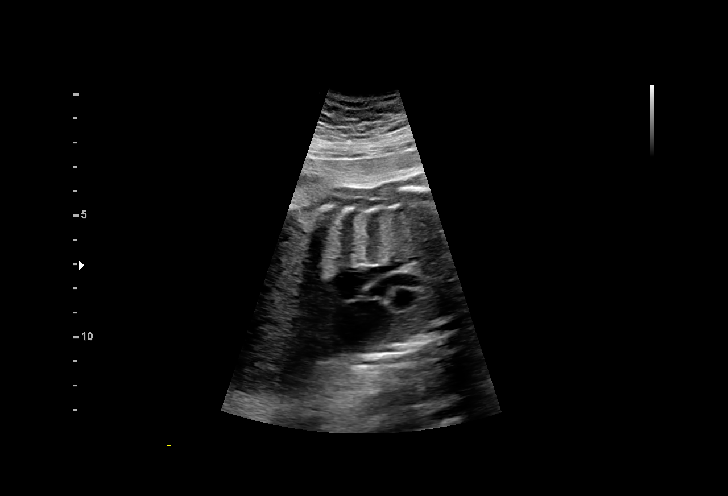
[im 45/58]
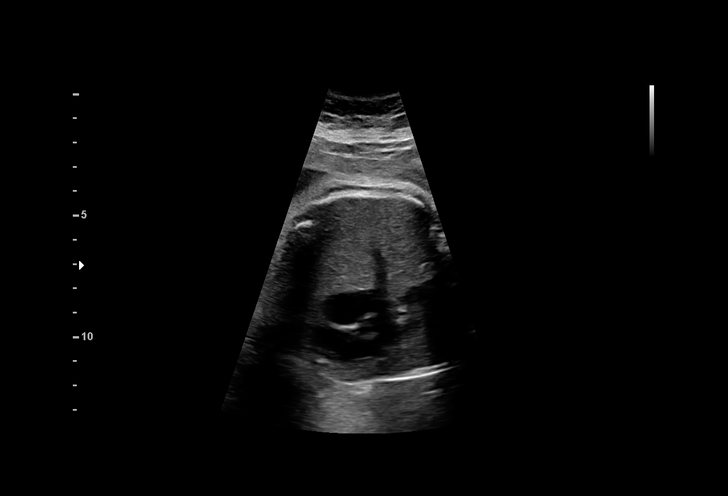
[im 49/58]
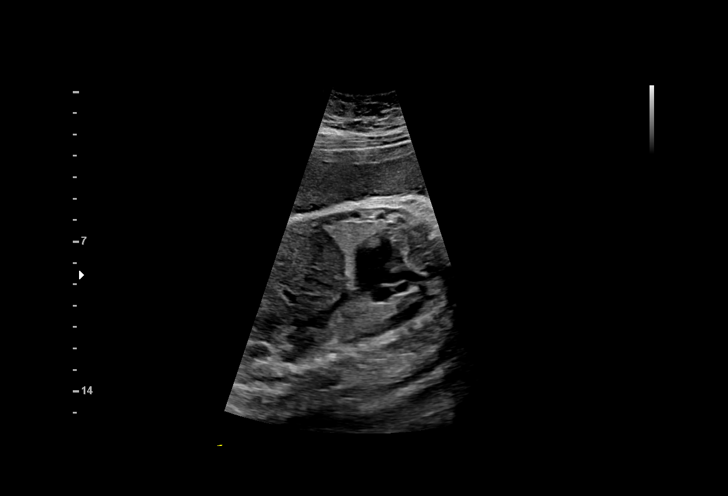
[im 53/58]
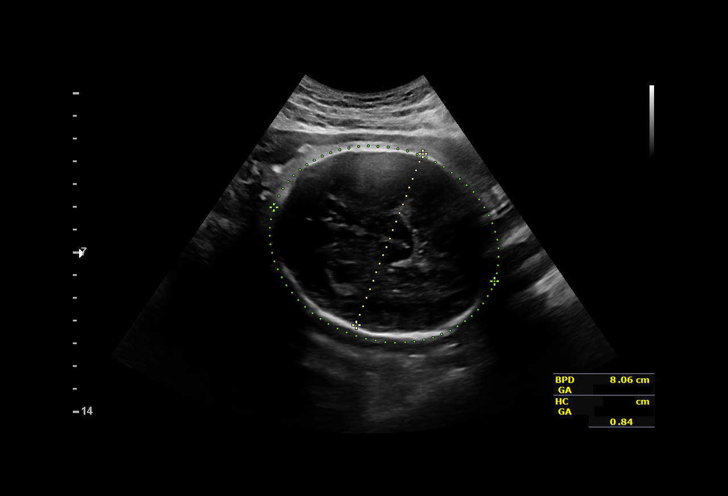
[im 58/58]
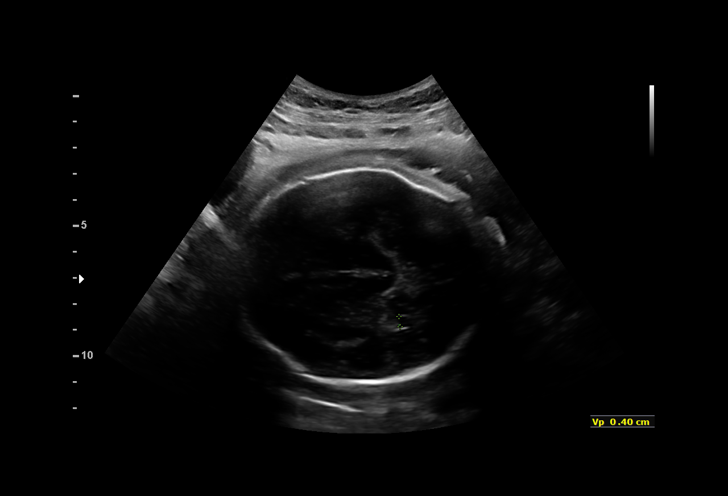

[14 of 28 positions shown; findings below may reference images not displayed]

pm)

[facility]yn Clinic

1  JONNI MAHAMED          [id number]      [id number]     [id number]
2  STEF ASUNCION           [id number]      [id number]     [id number]
Indications

35 weeks gestation of pregnancy
Drug use complicating pregnancy, third
trimester (polysubstance)
Fetal abnormality - other known or
suspected (renal pyelectasis)
Hypothyroid (no meds)
Encounter for other antenatal screening
follow-up
OB History

Gravidity:    23        Term:   2        Prem:   0        SAB:   20
TOP:          0       Ectopic:  0        Living: 2
Fetal Evaluation

Num Of Fetuses:     1
Fetal Heart         129
Rate(bpm):
Cardiac Activity:   Observed
Presentation:       Cephalic
Placenta:           Anterior, above cervical os
Amniotic Fluid
AFI FV:      Subjectively within normal limits

AFI Sum(cm)     %Tile       Largest Pocket(cm)
12.82           42

RUQ(cm)       RLQ(cm)       LUQ(cm)        LLQ(cm)
3.66
Biometry

BPD:      80.7  mm     G. Age:  32w 3d          1  %    CI:        76.37   %    70 - 86
FL/HC:      22.3   %    20.1 -
HC:      292.6  mm     G. Age:  32w 2d        < 3  %    HC/AC:      0.86        0.93 -
AC:      341.1  mm     G. Age:  38w 0d       > 97  %    FL/BPD:     80.9   %    71 - 87
FL:       65.3  mm     G. Age:  33w 5d          9  %    FL/AC:      19.1   %    20 - 24
HUM:      58.9  mm     G. Age:  34w 0d         40  %

Est. FW:    5608  gm      6 lb 1 oz     68  %
Gestational Age

LMP:           40w 0d        Date:  07/08/15                 EDD:   04/13/16
U/S Today:     34w 1d                                        EDD:   05/24/16
Best:          35w 3d     Det. By:  Early Ultrasound         EDD:   05/15/16
(10/25/15)
Anatomy

Cranium:               Appears normal         Aortic Arch:            Appears normal
Cavum:                 Appears normal         Ductal Arch:            Appears normal
Ventricles:            Appears normal         Diaphragm:              Appears normal
Choroid Plexus:        Previously seen        Stomach:                Appears normal, left
sided
Cerebellum:            Previously seen        Abdomen:                Appears normal
Posterior Fossa:       Previously seen        Abdominal Wall:         Previously seen
Nuchal Fold:           Not applicable (>20    Cord Vessels:           Previously seen
wks GA)
Face:                  Orbits and profile     Kidneys:                Appear normal
previously seen
Lips:                  Previously seen        Bladder:                Appears normal
Thoracic:              Previously seen        Spine:                  Previously seen
Heart:                 Appears normal         Upper Extremities:      Previously seen
(4CH, axis, and
situs)
RVOT:                  Appears normal         Lower Extremities:      Previously seen
LVOT:                  Appears normal

Other:  Fetus appears to be a male. Heels previously visualized. Technically
difficult due to advanced GA and fetal position.
Cervix Uterus Adnexa

Cervix
Not visualized (advanced GA >55wks)

Uterus
No abnormality visualized.
Left Ovary
Previously seen.

Right Ovary
Previously seen
Impression

Single living intrauterine pregnancy at 35 weeks 3 days.
Appropriate interval fetal growth (68%).
Normal amniotic fluid volume.
Normal interval fetal anatomy.
Urinary tract dilation has resolved--right 6.8 mm and left
mm.
Recommendations

No further growth ultrasounds are required.
If drug use is ongoing, I recommend once or twice weekly
fetal testing until delivery.
These were not ordered today, please call to schedule if
testing is desired.

## 2019-02-13 ENCOUNTER — Telehealth: Payer: Self-pay | Admitting: Pharmacist

## 2019-02-13 NOTE — Telephone Encounter (Signed)
Received call from patient's [facility] pharmacy (ph. [phone]). They are having to change brands of levothyroxine. Since this medication is NTI, I recommend to have labs in 6 weeks. Will forward information to PCP.

## 2019-02-17 NOTE — Telephone Encounter (Signed)
It appears that patient has an appointment with the new NP on 02/20/19

## 2019-02-20 ENCOUNTER — Encounter: Payer: Self-pay | Admitting: Family

## 2019-02-20 ENCOUNTER — Ambulatory Visit: Payer: Medicaid Other | Attending: Family Medicine | Admitting: Family

## 2019-02-20 ENCOUNTER — Other Ambulatory Visit: Payer: Self-pay

## 2019-02-20 VITALS — BP 110/72 | HR 76 | Temp 98.2°F | Resp 16 | Wt 204.4 lb

## 2019-02-20 DIAGNOSIS — F17210 Nicotine dependence, cigarettes, uncomplicated: Secondary | ICD-10-CM | POA: Diagnosis not present

## 2019-02-20 DIAGNOSIS — E039 Hypothyroidism, unspecified: Secondary | ICD-10-CM | POA: Insufficient documentation

## 2019-02-20 DIAGNOSIS — Z79899 Other long term (current) drug therapy: Secondary | ICD-10-CM | POA: Diagnosis not present

## 2019-02-20 DIAGNOSIS — R200 Anesthesia of skin: Secondary | ICD-10-CM | POA: Insufficient documentation

## 2019-02-20 DIAGNOSIS — Z7984 Long term (current) use of oral hypoglycemic drugs: Secondary | ICD-10-CM | POA: Insufficient documentation

## 2019-02-20 DIAGNOSIS — Z79890 Hormone replacement therapy (postmenopausal): Secondary | ICD-10-CM | POA: Diagnosis not present

## 2019-02-20 DIAGNOSIS — R300 Dysuria: Secondary | ICD-10-CM

## 2019-02-20 DIAGNOSIS — Z8632 Personal history of gestational diabetes: Secondary | ICD-10-CM | POA: Diagnosis not present

## 2019-02-20 DIAGNOSIS — M25551 Pain in right hip: Secondary | ICD-10-CM | POA: Diagnosis not present

## 2019-02-20 DIAGNOSIS — F4310 Post-traumatic stress disorder, unspecified: Secondary | ICD-10-CM | POA: Insufficient documentation

## 2019-02-20 MED ORDER — SULFAMETHOXAZOLE-TRIMETHOPRIM 800-160 MG PO TABS: 1.0000 | ORAL_TABLET | Freq: Two times a day (BID) | ORAL | 0 refills | Status: DC

## 2019-02-20 NOTE — Progress Notes (Signed)
Patient ID: Avaeh Ewer, female    DOB: Aug 02, 1980  MRN: [id number]  CC: Right side pain  Subjective: Takasha Vetere is a 39 y.o. female who presents for concerns of right side pain.  1. Right Side Numbness And Pain: Began 2 weeks ago. Denies falling. Denies trauma. Admits lifting 87 year-old son a lot. Denies radiation. Numbness is persistent all day. Admits intermittent sharp pain. Sitting down and relaxing makes it better. Walking and lifting makes it worse. Urinates a lot which is contributed to drinking a lot of water. Urine is clear amber. Unsure if urine has odor. Denies drinking soft drinks and sugary drinks. Drinks two cups of coffee per day. Reports that kidney infection last in October 2020. Concerned that she still has kidney infection. Reports taking antibiotics for kidney infection in October 2020 but unsure if the kidney infection ever went away. Reports a urine sample has been collected today. States that she began her menstruation 3 days ago.  2. Right Hip Numbness and Pain: Began 2 weeks ago. Pain depends on movement. Sometimes pain is 8/10.Constant pain and numbness. Was taking Ibuprofen and muscle relaxant but tends not to take them much unless she needs it. States that Ibuprofen does not relieve pain. Radiates to leg and foot. Denies falling. Denies rauma. Denies swelling. Denies redness. Denies warmth. Denies bowel and bladder incontinence. Admits popping sound heard while walking. Denies clicking while walking.     Patient Active Problem List   Diagnosis Date Noted  . NSVD (normal spontaneous vaginal delivery) 05/15/2016  . Indication for care or intervention in labor or delivery 05/10/2016  . Preeclampsia, severe, third trimester 05/10/2016  . Patient non-compliant 05/04/2016  . Drug abuse ([place]) 04/13/2016  . Gestational diabetes mellitus (GDM) affecting pregnancy 03/27/2016  . Homelessness 03/14/2016  . LGSIL on Pap smear of cervix 03/14/2016  . Supervision of  high risk pregnancy due to Social Issues 01/13/2016  . Trichomonal vaginitis during pregnancy 01/13/2016  . Hypothyroidism affecting pregnancy, antepartum 01/13/2016  . PTSD (post-traumatic stress disorder) 10/29/2015  . Developmental disability 10/29/2015  . Cannabis use disorder, moderate, dependence ([place]) 10/26/2015  . Suicidal ideation 10/26/2015  . Tobacco use disorder 10/26/2015  . Hypothyroidism 10/26/2015  . Severe recurrent major depressive disorder with psychotic features ([place]) 10/25/2015     Current Outpatient Medications on File Prior to Visit  Medication Sig Dispense Refill  . cetirizine (ZYRTEC) 10 MG tablet Take 1 tablet (10 mg total) by mouth daily. At bedtime to help with congestion 30 tablet 11  . diclofenac sodium (VOLTAREN) 1 % GEL Apply 2 g topically 4 (four) times daily. Rub into affected area of foot 2 to 4 times daily 100 g 2  . ibuprofen (ADVIL) 600 MG tablet Take 1 tablet (600 mg total) by mouth every 8 (eight) hours as needed for moderate pain. 60 tablet 4  . lansoprazole (PREVACID) 30 MG capsule TAKE 1 CAPSULE BY MOUTH ONCE DAILY AT NOON TO REDUCE STOMACH ACID 90 capsule 3  . levothyroxine (SYNTHROID) 125 MCG tablet TAKE 1 TABLET BY MOUTH ONCE DAILY BEFORE BREAKFAST 90 tablet 1  . metFORMIN (GLUCOPHAGE) 500 MG tablet TAKE 1 TABLET BY MOUTH ONCE DAILY AFTER SUPPER 90 tablet 3  . methocarbamol (ROBAXIN) 500 MG tablet Take 1 tablet (500 mg total) by mouth every 8 (eight) hours as needed for muscle spasms. 60 tablet 2  . rosuvastatin (CRESTOR) 10 MG tablet TAKE 1 TABLET BY MOUTH ONCE DAILY TO  LOWER  CHOLESTROL 90 tablet  3  . sertraline (ZOLOFT) 50 MG tablet Take 1 tablet (50 mg total) by mouth daily. 90 tablet 1  . sulfamethoxazole-trimethoprim (BACTRIM DS) 800-160 MG tablet Take 1 tablet by mouth 2 (two) times daily. 6 tablet 0  . triamcinolone (KENALOG) 0.025 % ointment Apply 1 application topically 2 (two) times daily. Apply to right skin lesion 30 g 0   No  current facility-administered medications on file prior to visit.    No Known Allergies  Social History   Socioeconomic History  . Marital status: Single    Spouse name: Not on file  . Number of children: Not on file  . Years of education: Not on file  . Highest education level: Not on file  Occupational History  . Not on file  Tobacco Use  . Smoking status: Current Every Day Smoker    Packs/day: 1.00    Years: 1.00    Pack years: 1.00    Types: Cigarettes  . Smokeless tobacco: Former Engineer, water and Sexual Activity  . Alcohol use: Yes    Comment: weekends  . Drug use: Yes    Types: Marijuana, Cocaine, "Crack" cocaine, MDMA (Ecstacy)    Comment: not using now, last used 3 months ago  . Sexual activity: Not Currently    Birth control/protection: None  Other Topics Concern  . Not on file  Social History Narrative  . Not on file   Social Determinants of Health   Financial Resource Strain:   . Difficulty of Paying Living Expenses: Not on file  Food Insecurity:   . Worried About Programme researcher, broadcasting/film/video in the Last Year: Not on file  . Ran Out of Food in the Last Year: Not on file  Transportation Needs:   . Lack of Transportation (Medical): Not on file  . Lack of Transportation (Non-Medical): Not on file  Physical Activity:   . Days of Exercise per Week: Not on file  . Minutes of Exercise per Session: Not on file  Stress:   . Feeling of Stress : Not on file  Social Connections:   . Frequency of Communication with Friends and Family: Not on file  . Frequency of Social Gatherings with Friends and Family: Not on file  . Attends Religious Services: Not on file  . Active Member of Clubs or Organizations: Not on file  . Attends Banker Meetings: Not on file  . Marital Status: Not on file  Intimate Partner Violence:   . Fear of Current or Ex-Partner: Not on file  . Emotionally Abused: Not on file  . Physically Abused: Not on file  . Sexually Abused: Not on  file    No family history on file.  Past Surgical History:  Procedure Laterality Date  . NO PAST SURGERIES      ROS: Review of Systems  Constitutional: Negative for chills and fever.  Respiratory: Negative for cough, sputum production, shortness of breath and wheezing.   Cardiovascular: Negative for chest pain, palpitations and leg swelling.  Negative except as stated above  PHYSICAL EXAM: There were no vitals taken for this visit.  Physical Exam General appearance - alert, well appearing, and in no distress and oriented to person, place, and time Mental status - alert, oriented to person, place, and time, normal mood, behavior, speech, dress, motor activity, and thought processes Chest - clear to auscultation, no wheezes, rales or rhonchi, symmetric air entry, no tachypnea, retractions or cyanosis Heart - normal rate, regular rhythm, normal S1,  S2, no murmurs, rubs, clicks or gallops, normal rate and regular rhythm, S1 and S2 normal, no murmurs noted, no gallops noted Abdomen - soft, nontender, nondistended, no masses or organomegaly tenderness noted right lumbar quadrant bowel sounds normal Back exam - full range of motion, no tenderness, palpable spasm or pain on motion Neurological - alert, oriented, normal speech, no focal findings or movement disorder noted, screening mental status exam normal, neck supple without rigidity, cranial nerves II through XII intact, DTR's normal and symmetric, motor and sensory grossly normal bilaterally, normal muscle tone, no tremors, strength 5/5 Musculoskeletal - right lower extremity full range of motion with tenderness during palpation at right lateral aspect of thigh, all other extremities full range of motion with no tenderness or pain noted Extremities - peripheral pulses normal, no pedal edema, no clubbing or cyanosis  CMP Latest Ref Rng & Units 11/14/2018 01/31/2018 10/31/2017  Glucose 65 - 99 mg/dL 341(P) 379(K) 240(X)  BUN 6 - 20 mg/dL  10 10 8   Creatinine 0.57 - 1.00 mg/dL 7.35 3.29  Sodium 134 - 144 mmol/L 138 138 136  Potassium 3.5 - 5.2 mmol/L 4.7 4.4 4.7  Chloride 96 - 106 mmol/L 101 101 99  CO2 20 - 29 mmol/L 20 19(L) 21  Calcium 8.7 - 10.2 mg/dL 9.24 9.5 9.9  Total Protein 6.0 - 8.5 g/dL 8.0 7.8 7.9  Total Bilirubin 0.0 - 1.2 mg/dL 26.8 0.3 0.3  Alkaline Phos 39 - 117 IU/L 147(H) 131(H) 133(H)  AST 0 - 40 IU/L 20 17 24   ALT 0 - 32 IU/L 20 19 23    Lipid Panel     Component Value Date/Time   CHOL 129 11/14/2018 1037   TRIG 203 (H) 11/14/2018 1037   HDL 41 11/14/2018 1037   CHOLHDL 3.1 11/14/2018 1037   CHOLHDL 2.7 10/27/2015 0627   VLDL 18 10/27/2015 0627   LDLCALC 55 11/14/2018 1037    CBC    Component Value Date/Time   WBC 9.6 11/14/2018 1037   WBC 13.1 (H) 05/10/2016 0046   RBC 4.62 11/14/2018 1037   RBC 4.37 05/10/2016 0046   HGB 12.3 11/14/2018 1037   HGB 10.8 12/27/2015 0000   HCT 38.8 11/14/2018 1037   HCT 33 12/27/2015 0000   PLT 327 11/14/2018 1037   PLT 287 12/27/2015 0000   MCV 84 11/14/2018 1037   MCH 26.6 11/14/2018 1037   MCH 25.2 (L) 05/10/2016 0046   MCHC 31.7 11/14/2018 1037   MCHC 31.6 05/10/2016 0046   RDW 13.9 11/14/2018 1037   LYMPHSABS 2.8 11/14/2018 1037   EOSABS 0.2 11/14/2018 1037   BASOSABS 0.0 11/14/2018 1037    ASSESSMENT AND PLAN: 1. DYSURIA: -Urinalysis and culture collected today, results pending -Take Sulfamethoxazole-Trimethoprim (BACTRIM DS) 800-160 mg; Take 1 tablet by mouth 2 (two) times daily; Dispense: 6 tablets; Refill: 0 -Patient prescribed Bactrim for presumptive urinary tract infection. -Patient encouraged to remain well hydrated. -Patient will be notified if change in therapy is advised.  2. RIGHT HIP PAIN:  -Take Ibuprofen for right hip pain  -Continue Ibuprofen (ADVIL); Take 1 tablet (600 mg total) by mouth every 8 (eight) hours as needed for moderate pain   Patient was given the opportunity to ask questions.  Patient verbalized  understanding of the plan and was able to repeat key elements of the plan. Patient was given clear instructions to go to Emergency Department or return to medical center if symptoms don't improve, worsen, or new problems develop.The patient verbalized  understanding.   Requested Prescriptions   Pending Prescriptions Disp Refills  . naproxen (NAPROSYN) 500 MG tablet 30 tablet 0    Sig: Take 1 tablet (500 mg total) by mouth 2 (two) times daily with a meal.       Caitland Porchia Jodi Geralds, NP

## 2019-02-20 NOTE — Patient Instructions (Addendum)
Ibuprofen for hip pain. Urinalysis pending. Septra for possible UTI. Follow-up with attending doctor on 03/17/2019 at 11:00 AM. Appointment already scheduled. Dysuria Dysuria is pain or discomfort while urinating. The pain or discomfort may be felt in the part of your body that drains urine from the bladder (urethra) or in the surrounding tissue of the genitals. The pain may also be felt in the groin area, lower abdomen, or lower back. You may have to urinate frequently or have the sudden feeling that you have to urinate (urgency). Dysuria can affect both men and women, but it is more common in women. Dysuria can be caused by many different things, including:  Urinary tract infection.  Kidney stones or bladder stones.  Certain sexually transmitted infections (STIs), such as chlamydia.  Dehydration.  Inflammation of the tissues of the vagina.  Use of certain medicines.  Use of certain soaps or scented products that cause irritation. Follow these instructions at home: General instructions  Watch your condition for any changes.  Urinate often. Avoid holding urine for long periods of time.  After a bowel movement or urination, women should cleanse from front to back, using each tissue only once.  Urinate after sexual intercourse.  Keep all follow-up visits as told by your health care provider. This is important.  If you had any tests done to find the cause of dysuria, it is up to you to get your test results. Ask your health care provider, or the department that is doing the test, when your results will be ready. Eating and drinking   Drink enough fluid to keep your urine pale yellow.  Avoid caffeine, tea, and alcohol. They can irritate the bladder and make dysuria worse. In men, alcohol may irritate the prostate. Medicines  Take over-the-counter and prescription medicines only as told by your health care provider.  If you were prescribed an antibiotic medicine, take it as told  by your health care provider. Do not stop taking the antibiotic even if you start to feel better. Contact a health care provider if:  You have a fever.  You develop pain in your back or sides.  You have nausea or vomiting.  You have blood in your urine.  You are not urinating as often as you usually do. Get help right away if:  Your pain is severe and not relieved with medicines.  You cannot eat or drink without vomiting.  You are confused.  You have a rapid heartbeat while at rest.  You have shaking or chills.  You feel extremely weak. Summary  Dysuria is pain or discomfort while urinating. Many different conditions can lead to dysuria.  If you have dysuria, you may have to urinate frequently or have the sudden feeling that you have to urinate (urgency).  Watch your condition for any changes. Keep all follow-up visits as told by your health care provider.  Make sure that you urinate often and drink enough fluid to keep your urine pale yellow. This information is not intended to replace advice given to you by your health care provider. Make sure you discuss any questions you have with your health care provider. Document Revised: 12/29/2016 Document Reviewed: 11/02/2016 Elsevier Patient Education  2020 [employer].

## 2019-02-21 NOTE — Telephone Encounter (Signed)
Patient has appointment scheduled on 03/17/2019 may labs be drawn during that time? Recommendation from pharmacy for 6 week lab drawn would be around the end of February.

## 2019-02-21 NOTE — Telephone Encounter (Signed)
I apologize for interrupting your visit yesterday. I was in the room next door and could hear a child being fussy and when I came out one of the CMA's told me who the patient was and asked if I wanted to step over and say hello. Our clinic "had adopted" his family for Christmas and one of his presents was still in my office that did not get wrapped so I told his mother before you came in that I would bring something over to help keep him occupied.

## 2019-02-21 NOTE — Telephone Encounter (Signed)
I am not sure what the delay was but I just received this message on 02/21/2019 and I know that patient was seen in office yesterday.

## 2019-02-22 LAB — URINALYSIS
Bilirubin, UA: NEGATIVE
Glucose, UA: NEGATIVE
Ketones, UA: NEGATIVE
Nitrite, UA: NEGATIVE
Protein,UA: NEGATIVE
Specific Gravity, UA: <=1.005 — AB (ref 1.005–1.030)
Urobilinogen, Ur: 0.2 mg/dL (ref 0.2–1.0)
pH, UA: 6.5 (ref 5.0–7.5)

## 2019-02-24 NOTE — Progress Notes (Signed)
Please call patient with update that urine culture is positive for beta hemolytic streptococcus, group B.   Ordered Amoxicillin, 500 mg, every 8 hours for 7 days. Prescription will be available for pick-up at the pharmacy located on file.  Urine has trace leukocytes. Patient  was empirically treated with Bactrim on February 20, 2019 for susceptible urinary tract infection.   Urine has 3+ red blood cells as patient indicated that she was menstruating at the time of the appointment.

## 2019-02-25 ENCOUNTER — Telehealth: Payer: Self-pay

## 2019-02-25 NOTE — Telephone Encounter (Signed)
Contacted pt to go over urine results pt didn't answer lvm asking pt to give a call back at her earliest convenience  

## 2019-02-26 LAB — URINE CULTURE

## 2019-02-27 ENCOUNTER — Telehealth: Payer: Self-pay | Admitting: Family

## 2019-02-27 ENCOUNTER — Telehealth: Payer: Self-pay | Admitting: Family Medicine

## 2019-02-27 DIAGNOSIS — N3090 Cystitis, unspecified without hematuria: Secondary | ICD-10-CM

## 2019-02-27 MED ORDER — AMOXICILLIN 500 MG PO CAPS: 500.0000 mg | ORAL_CAPSULE | Freq: Three times a day (TID) | ORAL | 0 refills | Status: DC | PRN

## 2019-02-27 NOTE — Telephone Encounter (Signed)
Note opened in error. Please see my previous telephone encounter note for 02/27/2019.

## 2019-02-27 NOTE — Telephone Encounter (Signed)
Larita Fife from [facility] on [address] called stating that the patient's quantity should be reviewed. She stated she was confused on the instructions or the quantity. Please fu at your earliest convenience.

## 2019-02-27 NOTE — Telephone Encounter (Signed)
amoxicillin (AMOXIL) 500 MG capsule  Every 8 hours PRN Take 1 capsule (500 mg total) by mouth every 8 (eight) hours as needed for up to 7 days.

## 2019-02-27 NOTE — Telephone Encounter (Signed)
If she is not having a fever or systemic symptoms such as vomiting then it should be okay to treat her with the appropriate antibiotics as per culture but you could also have her come into the office for Rocephin Injection if not allergic and see if she looks like further work-up might be needed

## 2019-02-27 NOTE — Telephone Encounter (Signed)
Regarding Amoxicillin 500mg 

## 2019-02-27 NOTE — Telephone Encounter (Signed)
Thank you. I

## 2019-02-27 NOTE — Addendum Note (Signed)
Addended by: Rema Fendt on: 02/27/2019 06:52 PM   Modules accepted: Orders

## 2019-02-28 ENCOUNTER — Telehealth: Payer: Self-pay | Admitting: Family

## 2019-02-28 MED ORDER — CEFTRIAXONE SODIUM 250 MG IJ SOLR: 250.0000 mg | Freq: Once | INTRAMUSCULAR | Status: DC

## 2019-02-28 MED ORDER — CEPHALEXIN 500 MG PO CAPS: 500.0000 mg | ORAL_CAPSULE | Freq: Four times a day (QID) | ORAL | 0 refills | Status: DC

## 2019-02-28 NOTE — Telephone Encounter (Signed)
Called patient at 7:54 am and 7:56 am with no response.  Called patient at 8:06 am with no response. Left voicemail and asked patient to return call as soon as possible.  Will update Dr. Jillyn Hidden of these occurrences.

## 2019-02-28 NOTE — Telephone Encounter (Signed)
Note opened in error please see previous note.

## 2019-02-28 NOTE — Telephone Encounter (Signed)
Medication has been cancelled. Will call patient's pharmacy with update to not dispense medication.

## 2019-02-28 NOTE — Telephone Encounter (Signed)
Note opened in error please see previous note. 

## 2019-02-28 NOTE — Progress Notes (Addendum)
Order cancelled for Amoxicillin (AMOXIL) 500 mg by mouth every 8 (eight) hours for 7 days. Order for Amoxicillin was entered when patient's urine analysis and culture returned positive for beta hemolytic streptococcus group B strep.  Patient's urinalysis and culture positive for beta hemolytic streptococcus group B and staph aureus.   On yesterday spoke with Dr. Judyann Munson with Infectious Disease at [facility]. Advised to discontinue Amoxicillin for group B strep coverage. Advised to begin antibiotic regimen of IM Ceftriaxone (ROCEPHIN) 250 mg for one time dose; Cephalexin (KEFLEX) 500 mg four times per day; and collect blood cultures to rule out systemic infection of staph aureus.   Ceftriaxone and Cephalexin will cover both group B strep and staph aureus.

## 2019-02-28 NOTE — Addendum Note (Signed)
Addended by: Rema Fendt on: 02/28/2019 08:01 AM   Modules accepted: Orders

## 2019-03-17 ENCOUNTER — Ambulatory Visit: Payer: Medicaid Other | Attending: Family Medicine | Admitting: Family Medicine

## 2019-03-17 ENCOUNTER — Ambulatory Visit: Payer: Medicaid Other | Admitting: Family Medicine

## 2019-03-17 ENCOUNTER — Other Ambulatory Visit: Payer: Self-pay

## 2019-03-17 ENCOUNTER — Encounter: Payer: Self-pay | Admitting: Family Medicine

## 2019-03-17 DIAGNOSIS — Z79899 Other long term (current) drug therapy: Secondary | ICD-10-CM

## 2019-03-17 DIAGNOSIS — M62830 Muscle spasm of back: Secondary | ICD-10-CM

## 2019-03-17 DIAGNOSIS — F411 Generalized anxiety disorder: Secondary | ICD-10-CM | POA: Diagnosis not present

## 2019-03-17 DIAGNOSIS — J309 Allergic rhinitis, unspecified: Secondary | ICD-10-CM

## 2019-03-17 DIAGNOSIS — E039 Hypothyroidism, unspecified: Secondary | ICD-10-CM

## 2019-03-17 DIAGNOSIS — E782 Mixed hyperlipidemia: Secondary | ICD-10-CM | POA: Diagnosis not present

## 2019-03-17 DIAGNOSIS — E119 Type 2 diabetes mellitus without complications: Secondary | ICD-10-CM

## 2019-03-17 DIAGNOSIS — K219 Gastro-esophageal reflux disease without esophagitis: Secondary | ICD-10-CM

## 2019-03-17 MED ORDER — SERTRALINE HCL 50 MG PO TABS: 50.0000 mg | ORAL_TABLET | Freq: Every day | ORAL | 1 refills | Status: DC

## 2019-03-17 MED ORDER — METFORMIN HCL 500 MG PO TABS: ORAL_TABLET | ORAL | 3 refills | Status: DC

## 2019-03-17 MED ORDER — ROSUVASTATIN CALCIUM 10 MG PO TABS: ORAL_TABLET | ORAL | 3 refills | Status: DC

## 2019-03-17 MED ORDER — ACCU-CHEK GUIDE ME W/DEVICE KIT: 1.0000 | PACK | Freq: Every day | 0 refills | Status: AC

## 2019-03-17 MED ORDER — ACCU-CHEK FASTCLIX LANCETS MISC: 11 refills | Status: DC

## 2019-03-17 MED ORDER — METHOCARBAMOL 500 MG PO TABS: 500.0000 mg | ORAL_TABLET | Freq: Three times a day (TID) | ORAL | 2 refills | Status: DC | PRN

## 2019-03-17 MED ORDER — CETIRIZINE HCL 10 MG PO TABS: 10.0000 mg | ORAL_TABLET | Freq: Every day | ORAL | 11 refills | Status: DC

## 2019-03-17 MED ORDER — ACCU-CHEK GUIDE VI STRP: ORAL_STRIP | 12 refills | Status: DC

## 2019-03-17 MED ORDER — ACCU-CHEK FASTCLIX LANCET KIT: PACK | 11 refills | Status: DC

## 2019-03-17 MED ORDER — LANSOPRAZOLE 30 MG PO CPDR: DELAYED_RELEASE_CAPSULE | ORAL | 3 refills | Status: DC

## 2019-03-17 MED ORDER — LEVOTHYROXINE SODIUM 125 MCG PO TABS: ORAL_TABLET | ORAL | 1 refills | Status: DC
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
## 2019-03-17 NOTE — Progress Notes (Signed)
Patient verified DOB Patient has not eaten or taken medication today. Patient denies pain at this time.

## 2019-03-26 ENCOUNTER — Ambulatory Visit: Payer: Medicaid Other | Attending: Family Medicine

## 2019-03-26 ENCOUNTER — Other Ambulatory Visit: Payer: Self-pay

## 2019-03-26 DIAGNOSIS — E039 Hypothyroidism, unspecified: Secondary | ICD-10-CM

## 2019-03-26 DIAGNOSIS — E119 Type 2 diabetes mellitus without complications: Secondary | ICD-10-CM

## 2019-03-26 DIAGNOSIS — E782 Mixed hyperlipidemia: Secondary | ICD-10-CM

## 2019-03-26 DIAGNOSIS — Z79899 Other long term (current) drug therapy: Secondary | ICD-10-CM

## 2019-03-27 LAB — COMPREHENSIVE METABOLIC PANEL WITH GFR
ALT: 14 IU/L (ref 0–32)
AST: 14 IU/L (ref 0–40)
Albumin/Globulin Ratio: 1.7 (ref 1.2–2.2)
Albumin: 4.8 g/dL (ref 3.8–4.8)
Alkaline Phosphatase: 125 IU/L — ABNORMAL HIGH (ref 39–117)
BUN/Creatinine Ratio: 11 (ref 9–23)
BUN: 9 mg/dL (ref 6–20)
Bilirubin Total: 0.2 mg/dL (ref 0.0–1.2)
CO2: 21 mmol/L (ref 20–29)
Calcium: 9.9 mg/dL (ref 8.7–10.2)
Chloride: 101 mmol/L (ref 96–106)
Creatinine, Ser: 0.81 mg/dL (ref 0.57–1.00)
GFR calc Af Amer: 107 mL/min/1.73
GFR calc non Af Amer: 93 mL/min/1.73
Globulin, Total: 2.9 g/dL (ref 1.5–4.5)
Glucose: 146 mg/dL — ABNORMAL HIGH (ref 65–99)
Potassium: 4.7 mmol/L (ref 3.5–5.2)
Sodium: 137 mmol/L (ref 134–144)
Total Protein: 7.7 g/dL (ref 6.0–8.5)

## 2019-03-27 LAB — MICROALBUMIN / CREATININE URINE RATIO
Creatinine, Urine: 42.3 mg/dL
Microalb/Creat Ratio: 9 mg/g{creat} (ref 0–29)
Microalbumin, Urine: 3.9 ug/mL

## 2019-03-27 LAB — HEMOGLOBIN A1C
Est. average glucose Bld gHb Est-mCnc: 157 mg/dL
Hgb A1c MFr Bld: 7.1 % — ABNORMAL HIGH (ref 4.8–5.6)

## 2019-03-27 LAB — LIPID PANEL
Chol/HDL Ratio: 2.8 ratio (ref 0.0–4.4)
Cholesterol, Total: 107 mg/dL (ref 100–199)
HDL: 38 mg/dL — ABNORMAL LOW
LDL Chol Calc (NIH): 46 mg/dL (ref 0–99)
Triglycerides: 126 mg/dL (ref 0–149)
VLDL Cholesterol Cal: 23 mg/dL (ref 5–40)

## 2019-03-27 LAB — TSH: TSH: 0.141 u[IU]/mL — ABNORMAL LOW (ref 0.450–4.500)

## 2019-04-03 ENCOUNTER — Telehealth (INDEPENDENT_AMBULATORY_CARE_PROVIDER_SITE_OTHER): Payer: Self-pay

## 2019-04-03 NOTE — Telephone Encounter (Signed)
Patient verified date of birth. She is aware that A1c of 7.1 shows good control of blood sugars over the past 90 days. microalbumin is normal. TSH slightly low; will draw thyroid panel at next office visit to see if dose of thyroid medication needs to be adjusted. She verbalized understanding of results. Maryjean Morn, CMA

## 2019-04-03 NOTE — Telephone Encounter (Signed)
-----   Message from Cain Saupe, MD sent at 04/01/2019  1:34 AM EST ----- Hemoglobin A1c of 7.1 shows good control of blood sugars over the past 90 days. Normal microalbumin creatinine ratio. TSH is slightly low, will obtain complete thyroid panel at your upcoming visit to see if the dose of thyroid medication needs to be adjusted.

## 2019-04-11 ENCOUNTER — Ambulatory Visit: Payer: Medicaid Other | Admitting: Family Medicine

## 2019-04-16 ENCOUNTER — Ambulatory Visit: Payer: Medicaid Other | Attending: Family Medicine | Admitting: Physician Assistant

## 2019-04-16 ENCOUNTER — Other Ambulatory Visit: Payer: Self-pay

## 2019-04-16 DIAGNOSIS — Z3009 Encounter for other general counseling and advice on contraception: Secondary | ICD-10-CM

## 2019-04-16 DIAGNOSIS — E119 Type 2 diabetes mellitus without complications: Secondary | ICD-10-CM

## 2019-04-16 NOTE — Progress Notes (Signed)
Ref to [facility] to get Nexplanon removed and put back in

## 2019-04-16 NOTE — Progress Notes (Signed)
Patient ID: Mary Ramirez, female   DOB: 1980/06/19, 39 y.o.   MRN: [id number] Virtual Visit via Telephone Note  I connected with Mary Ramirez on 04/16/19 at 11:10 AM EDT by telephone and verified that I am speaking with the correct person using two identifiers.   I discussed the limitations, risks, security and privacy concerns of performing an evaluation and management service by telephone and the availability of in person appointments. I also discussed with the patient that there may be a patient responsible charge related to this service. The patient expressed understanding and agreed to proceed.  PATIENT visit by telephone virtually in the context of Covid-19 pandemic. Patient location:  home My Location:  [facility] office Persons on the call:  Me and the patient  History of Present Illness:  Patient has had nexplanon since 2018.  Needs to get it changed.    Blood sugars running from 88-146.      Observations/Objective:  NAD.  Patient sounds sleepy   Assessment and Plan: 1. Birth control counseling Use condoms until after new insertion since right at 3 years - Ambulatory referral to Gynecology  2. Type 2 diabetes mellitus without complication, without long-term current use of insulin (HCC) Work on diet/eliminating sugars.  Continue current medications    Follow Up Instructions: See PCP in ~4 months   I discussed the assessment and treatment plan with the patient. The patient was provided an opportunity to ask questions and all were answered. The patient agreed with the plan and demonstrated an understanding of the instructions.   The patient was advised to call back or seek an in-person evaluation if the symptoms worsen or if the condition fails to improve as anticipated.  I provided 7 minutes of non-face-to-face time during this encounter.   Georgian Co, PA-C

## 2019-05-19 ENCOUNTER — Ambulatory Visit: Payer: Medicaid Other | Admitting: Obstetrics & Gynecology

## 2019-11-21 ENCOUNTER — Telehealth: Payer: Self-pay | Admitting: Family Medicine

## 2019-11-21 ENCOUNTER — Ambulatory Visit: Payer: Medicaid Other | Attending: Family Medicine | Admitting: Family Medicine

## 2019-11-21 ENCOUNTER — Other Ambulatory Visit: Payer: Self-pay

## 2019-11-21 ENCOUNTER — Encounter: Payer: Self-pay | Admitting: Family Medicine

## 2019-11-21 VITALS — BP 118/79 | HR 97 | Temp 97.5°F | Ht 63.5 in | Wt 196.2 lb

## 2019-11-21 DIAGNOSIS — J309 Allergic rhinitis, unspecified: Secondary | ICD-10-CM

## 2019-11-21 DIAGNOSIS — Z760 Encounter for issue of repeat prescription: Secondary | ICD-10-CM | POA: Diagnosis present

## 2019-11-21 DIAGNOSIS — Z79899 Other long term (current) drug therapy: Secondary | ICD-10-CM

## 2019-11-21 DIAGNOSIS — Z87440 Personal history of urinary (tract) infections: Secondary | ICD-10-CM | POA: Diagnosis not present

## 2019-11-21 DIAGNOSIS — K219 Gastro-esophageal reflux disease without esophagitis: Secondary | ICD-10-CM

## 2019-11-21 DIAGNOSIS — F411 Generalized anxiety disorder: Secondary | ICD-10-CM | POA: Diagnosis not present

## 2019-11-21 DIAGNOSIS — Z7984 Long term (current) use of oral hypoglycemic drugs: Secondary | ICD-10-CM | POA: Insufficient documentation

## 2019-11-21 DIAGNOSIS — E039 Hypothyroidism, unspecified: Secondary | ICD-10-CM | POA: Diagnosis not present

## 2019-11-21 DIAGNOSIS — R5383 Other fatigue: Secondary | ICD-10-CM | POA: Diagnosis not present

## 2019-11-21 DIAGNOSIS — N390 Urinary tract infection, site not specified: Secondary | ICD-10-CM

## 2019-11-21 DIAGNOSIS — Z1159 Encounter for screening for other viral diseases: Secondary | ICD-10-CM

## 2019-11-21 DIAGNOSIS — E782 Mixed hyperlipidemia: Secondary | ICD-10-CM

## 2019-11-21 DIAGNOSIS — Z23 Encounter for immunization: Secondary | ICD-10-CM

## 2019-11-21 DIAGNOSIS — E119 Type 2 diabetes mellitus without complications: Secondary | ICD-10-CM

## 2019-11-21 DIAGNOSIS — Z789 Other specified health status: Secondary | ICD-10-CM

## 2019-11-21 DIAGNOSIS — F17210 Nicotine dependence, cigarettes, uncomplicated: Secondary | ICD-10-CM | POA: Diagnosis not present

## 2019-11-21 DIAGNOSIS — M545 Low back pain, unspecified: Secondary | ICD-10-CM

## 2019-11-21 LAB — POCT URINALYSIS DIP (CLINITEK)
Bilirubin, UA: NEGATIVE
Blood, UA: NEGATIVE
Glucose, UA: NEGATIVE mg/dL
Ketones, POC UA: NEGATIVE mg/dL
Leukocytes, UA: NEGATIVE
Nitrite, UA: NEGATIVE
POC PROTEIN,UA: NEGATIVE
Spec Grav, UA: <=1.005 — AB
Urobilinogen, UA: 0.2 U/dL
pH, UA: 6.5

## 2019-11-21 LAB — POCT GLYCOSYLATED HEMOGLOBIN (HGB A1C): Hemoglobin A1C: 6.2 % — AB (ref 4.0–5.6)

## 2019-11-21 LAB — GLUCOSE, POCT (MANUAL RESULT ENTRY): POC Glucose: 151 mg/dL — AB (ref 70–99)

## 2019-11-21 MED ORDER — LANSOPRAZOLE 30 MG PO CPDR: DELAYED_RELEASE_CAPSULE | ORAL | 3 refills | Status: DC

## 2019-11-21 MED ORDER — CETIRIZINE HCL 10 MG PO TABS: 10.0000 mg | ORAL_TABLET | Freq: Every day | ORAL | 11 refills | Status: DC

## 2019-11-21 MED ORDER — ROSUVASTATIN CALCIUM 10 MG PO TABS: ORAL_TABLET | ORAL | 3 refills | Status: DC

## 2019-11-21 MED ORDER — METFORMIN HCL 500 MG PO TABS: ORAL_TABLET | ORAL | 3 refills | Status: DC

## 2019-11-21 MED ORDER — CEPHALEXIN 500 MG PO CAPS: 500.0000 mg | ORAL_CAPSULE | Freq: Two times a day (BID) | ORAL | 0 refills | Status: DC

## 2019-11-21 MED ORDER — METHOCARBAMOL 500 MG PO TABS: 500.0000 mg | ORAL_TABLET | Freq: Three times a day (TID) | ORAL | 2 refills | Status: DC | PRN

## 2019-11-21 MED ORDER — IBUPROFEN 600 MG PO TABS: 600.0000 mg | ORAL_TABLET | Freq: Three times a day (TID) | ORAL | 4 refills | Status: DC | PRN

## 2019-11-21 MED ORDER — LEVOTHYROXINE SODIUM 125 MCG PO TABS: ORAL_TABLET | ORAL | 1 refills | Status: DC

## 2019-11-21 MED ORDER — TRAZODONE HCL 50 MG PO TABS: 25.0000 mg | ORAL_TABLET | Freq: Every evening | ORAL | 3 refills | Status: DC | PRN

## 2019-11-21 MED ORDER — CEPHALEXIN 500 MG PO CAPS: 500.0000 mg | ORAL_CAPSULE | Freq: Two times a day (BID) | ORAL | 0 refills | Status: AC

## 2019-11-21 MED ORDER — SERTRALINE HCL 50 MG PO TABS: 50.0000 mg | ORAL_TABLET | Freq: Every day | ORAL | 1 refills | Status: DC

## 2019-11-21 MED FILL — SERTRALINE HCL 50 MG TABLET: 50 | 90 days supply | Qty: 90 | Fill #0

## 2019-11-21 MED FILL — CEPHALEXIN 500 MG CAPSULE: 500 | 7 days supply | Qty: 14 | Fill #0

## 2019-11-21 MED FILL — METFORMIN HCL 500 MG TABS: 500 | 90 days supply | Qty: 90 | Fill #0

## 2019-11-21 MED FILL — LANSOPRAZOLE DR 30 MG CAPSU: 30 | 90 days supply | Qty: 90 | Fill #0

## 2019-11-21 MED FILL — IBUPROFEN 600 MG TABLET: 600 | 15 days supply | Qty: 60 | Fill #0

## 2019-11-21 MED FILL — METHOCARBAMOL 500 MG TABS: 500 | 15 days supply | Qty: 60 | Fill #0

## 2019-11-21 MED FILL — LEVOTHYROXINE 125 MCG TAB: 125 | 90 days supply | Qty: 90 | Fill #0

## 2019-11-21 MED FILL — traZODone HCL 50 MG TABS: 50 | 90 days supply | Qty: 90 | Fill #0
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
## 2019-11-21 NOTE — Telephone Encounter (Signed)
Rx sent 

## 2019-11-21 NOTE — Telephone Encounter (Signed)
Patient called to inform the office that her medication was sent to the incorrect pharmacy.  It should have been sent to the [facility] pharmacy listed in her chart.   Patient would like this corrected and sent to the correct pharmacy.

## 2019-11-21 NOTE — Progress Notes (Signed)
Thyroid meds

## 2019-11-22 LAB — COMPREHENSIVE METABOLIC PANEL WITH GFR
ALT: 16 IU/L (ref 0–32)
AST: 17 IU/L (ref 0–40)
Albumin/Globulin Ratio: 1.5 (ref 1.2–2.2)
Albumin: 5 g/dL — ABNORMAL HIGH (ref 3.8–4.8)
Alkaline Phosphatase: 125 IU/L — ABNORMAL HIGH (ref 44–121)
BUN/Creatinine Ratio: 13 (ref 9–23)
BUN: 11 mg/dL (ref 6–20)
Bilirubin Total: 0.3 mg/dL (ref 0.0–1.2)
CO2: 19 mmol/L — ABNORMAL LOW (ref 20–29)
Calcium: 10.1 mg/dL (ref 8.7–10.2)
Chloride: 100 mmol/L (ref 96–106)
Creatinine, Ser: 0.85 mg/dL (ref 0.57–1.00)
GFR calc Af Amer: 101 mL/min/1.73
GFR calc non Af Amer: 88 mL/min/1.73
Globulin, Total: 3.3 g/dL (ref 1.5–4.5)
Glucose: 181 mg/dL — ABNORMAL HIGH (ref 65–99)
Potassium: 4.4 mmol/L (ref 3.5–5.2)
Sodium: 139 mmol/L (ref 134–144)
Total Protein: 8.3 g/dL (ref 6.0–8.5)

## 2019-11-22 LAB — THYROID PANEL WITH TSH
Free Thyroxine Index: 1.9 (ref 1.2–4.9)
T3 Uptake Ratio: 25 % (ref 24–39)
T4, Total: 7.7 ug/dL (ref 4.5–12.0)
TSH: 1.53 u[IU]/mL (ref 0.450–4.500)

## 2019-11-22 LAB — HEPATITIS PANEL, ACUTE
Hep A IgM: NEGATIVE
Hep B C IgM: NEGATIVE
Hep C Virus Ab: <0.1 {s_co_ratio} (ref 0.0–0.9)
Hepatitis B Surface Ag: NEGATIVE

## 2019-11-25 ENCOUNTER — Other Ambulatory Visit: Payer: Self-pay | Admitting: Family Medicine

## 2019-11-25 LAB — URINE CULTURE

## 2019-11-25 NOTE — Progress Notes (Signed)
Normal thyroid panel-continue current dose of thyroid medication.  Glucose of 181 otherwise normal electrolytes, stable mild increase in alkaline phosphatase at 125 with normal being 44-121.  Alkaline phosphatase is a nonspecific liver enzyme as it is found in other parts of the body.  Negative hepatitis panel.  Urine culture did show presence of urinary tract infection and the bacteria sensitive to the antibiotic that you were prescribed at your recent visit.

## 2019-11-28 ENCOUNTER — Telehealth: Payer: Self-pay | Admitting: Licensed Clinical Social Worker

## 2019-11-28 NOTE — Telephone Encounter (Signed)
MSW Intern called Pt and left message to make an appt with social work services in regards to a referral made by Dr. Jillyn Hidden. A callback number was provided.

## 2019-12-08 ENCOUNTER — Other Ambulatory Visit: Payer: Self-pay

## 2019-12-08 ENCOUNTER — Ambulatory Visit: Payer: Medicaid Other | Attending: Family Medicine | Admitting: Licensed Clinical Social Worker

## 2019-12-08 DIAGNOSIS — Z599 Problem related to housing and economic circumstances, unspecified: Secondary | ICD-10-CM

## 2019-12-09 ENCOUNTER — Telehealth: Payer: Self-pay | Admitting: Family Medicine

## 2019-12-09 NOTE — Telephone Encounter (Signed)
Copied from CRM [id number]. Topic: General - Other >> Dec 09, 2019 12:09 PM Gwenlyn Fudge wrote: Reason for CRM: Pt called and is requesting to speak with Case Worker regarding her son Venba Zenner. She states that she is needing to give a list of toys that he likes. She states that he likes Trucks, Cars, blocks, car tracks, to color.  Please advise.

## 2019-12-20 NOTE — BH Specialist Note (Signed)
LCSW met with patient, accompanied by minor son, Rodman Key. Pt shared interest in participation in [facility] Christmas program. Suggestions and sizes were provided.   Pt is doing well managing mental health. She receives services through [facility]. No additional concerns noted.

## 2020-01-16 NOTE — Telephone Encounter (Signed)
Return call placed to patient. Patient was informed that presents for child will be delivered no later than Wednesday, December 22, 21. Pt verbalized understanding. No additional concerns noted.

## 2020-01-16 NOTE — Telephone Encounter (Signed)
Pt called to speak with Jenel Lucks about the clinic xmas care program and the items for her son she needed to speak with someone about getting items to her home because she wont be able to carry things on the bus with her/ please give pt a call

## 2020-02-23 ENCOUNTER — Ambulatory Visit: Payer: Medicaid Other | Admitting: Family Medicine

## 2020-03-09 ENCOUNTER — Ambulatory Visit: Payer: Medicaid Other | Admitting: Family Medicine

## 2020-03-25 ENCOUNTER — Other Ambulatory Visit: Payer: Self-pay | Admitting: Family Medicine

## 2020-03-25 DIAGNOSIS — E039 Hypothyroidism, unspecified: Secondary | ICD-10-CM

## 2020-03-25 NOTE — Telephone Encounter (Signed)
Medication: levothyroxine (SYNTHROID) 125 MCG tablet [[id number]]  Apt 05/11/20  Has the patient contacted their pharmacy? YES  (Agent: If no, request that the patient contact the pharmacy for the refill.) (Agent: If yes, when and what did the pharmacy advise?)  Preferred Pharmacy (with phone number or street name): [facility] [address] [place], [place] - [address]  [address], [place] [address]  Phone:  [phone] Fax:  [phone]   Agent: Please be advised that RX refills may take up to 3 business days. We ask that you follow-up with your pharmacy.

## 2020-04-29 ENCOUNTER — Telehealth: Payer: Self-pay | Admitting: Family Medicine

## 2020-04-29 NOTE — Telephone Encounter (Signed)
Pt has an upcoming appt with Zelda on 4/13. Will forward to provider for new rxs be sent to pharmacy

## 2020-04-29 NOTE — Telephone Encounter (Signed)
Copied from CRM [id number]. Topic: General - Other >> Apr 29, 2020 10:35 AM Gaetana Michaelis A wrote: Reason for CRM: Wilkie Aye T with [facility] has made contact regarding patient's prescriptions   Patient has refills remaining on multiple prescriptions but they will need to be represcribed by a new physician due to the status of the previous prescribing MD  Medicaid will not continue to cover prescriptions for patient until they are resubmitted under a new prescribing Provider  Please contact to further advise when possible   Patient has upcoming appointment with Meredeth Ide. Please advise.

## 2020-05-02 NOTE — Telephone Encounter (Signed)
She has enough refills to last until she sees me. Most of her medications were filled for 6 months from October

## 2020-05-05 ENCOUNTER — Other Ambulatory Visit: Payer: Self-pay

## 2020-05-05 ENCOUNTER — Telehealth: Payer: Self-pay | Admitting: Nurse Practitioner

## 2020-05-05 ENCOUNTER — Other Ambulatory Visit: Payer: Self-pay | Admitting: Nurse Practitioner

## 2020-05-05 DIAGNOSIS — E119 Type 2 diabetes mellitus without complications: Secondary | ICD-10-CM

## 2020-05-05 DIAGNOSIS — E039 Hypothyroidism, unspecified: Secondary | ICD-10-CM

## 2020-05-05 DIAGNOSIS — J309 Allergic rhinitis, unspecified: Secondary | ICD-10-CM

## 2020-05-05 DIAGNOSIS — M545 Low back pain, unspecified: Secondary | ICD-10-CM

## 2020-05-05 DIAGNOSIS — E782 Mixed hyperlipidemia: Secondary | ICD-10-CM

## 2020-05-05 DIAGNOSIS — F411 Generalized anxiety disorder: Secondary | ICD-10-CM

## 2020-05-05 DIAGNOSIS — K219 Gastro-esophageal reflux disease without esophagitis: Secondary | ICD-10-CM

## 2020-05-05 MED ORDER — TRIAMCINOLONE ACETONIDE 0.025 % EX OINT
1.0000 "application " | TOPICAL_OINTMENT | Freq: Two times a day (BID) | CUTANEOUS | 0 refills | Status: DC
  Filled 2020-05-05: qty 30, 15d supply, fill #0

## 2020-05-05 MED ORDER — ACCU-CHEK FASTCLIX LANCET KIT
PACK | 11 refills | Status: DC
  Filled 2020-05-05: qty 1, fill #0

## 2020-05-05 MED ORDER — ROSUVASTATIN CALCIUM 10 MG PO TABS
ORAL_TABLET | ORAL | 3 refills | Status: DC
  Filled 2020-05-05: qty 90, 90d supply, fill #0

## 2020-05-05 MED ORDER — TRAZODONE HCL 50 MG PO TABS
25.0000 mg | ORAL_TABLET | Freq: Every evening | ORAL | 3 refills | Status: DC | PRN
  Filled 2020-05-05: qty 30, 30d supply, fill #0

## 2020-05-05 MED ORDER — SERTRALINE HCL 50 MG PO TABS
50.0000 mg | ORAL_TABLET | Freq: Every day | ORAL | 1 refills | Status: DC
  Filled 2020-05-05: qty 90, 90d supply, fill #0

## 2020-05-05 MED ORDER — METHOCARBAMOL 500 MG PO TABS
500.0000 mg | ORAL_TABLET | Freq: Three times a day (TID) | ORAL | 2 refills | Status: DC | PRN
  Filled 2020-05-05: qty 60, 20d supply, fill #0

## 2020-05-05 MED ORDER — METFORMIN HCL 500 MG PO TABS
ORAL_TABLET | ORAL | 0 refills | Status: DC
  Filled 2020-05-05: qty 30, 30d supply, fill #0

## 2020-05-05 MED ORDER — ACCU-CHEK FASTCLIX LANCETS MISC
11 refills | Status: DC
  Filled 2020-05-05: qty 102, 102d supply, fill #0

## 2020-05-05 MED ORDER — LANSOPRAZOLE 30 MG PO CPDR
DELAYED_RELEASE_CAPSULE | ORAL | 3 refills | Status: DC
  Filled 2020-05-05: qty 90, 90d supply, fill #0

## 2020-05-05 MED ORDER — ACCU-CHEK GUIDE VI STRP
ORAL_STRIP | 12 refills | Status: DC
  Filled 2020-05-05: qty 100, 90d supply, fill #0

## 2020-05-05 MED ORDER — CETIRIZINE HCL 10 MG PO TABS
10.0000 mg | ORAL_TABLET | Freq: Every day | ORAL | 11 refills | Status: DC
  Filled 2020-05-05: qty 30, 30d supply, fill #0

## 2020-05-05 MED ORDER — LEVOTHYROXINE SODIUM 125 MCG PO TABS
ORAL_TABLET | ORAL | 0 refills | Status: DC
  Filled 2020-05-05: qty 30, 30d supply, fill #0

## 2020-05-05 NOTE — Telephone Encounter (Signed)
Copied from CRM [id number]. Topic: General - Other >> Apr 29, 2020 10:35 AM Gaetana Michaelis A wrote: Reason for CRM: Wilkie Aye T with [facility] has made contact regarding patient's prescriptions   Patient has refills remaining on multiple prescriptions but they will need to be represcribed by a new physician due to the status of the previous prescribing MD  Medicaid will not continue to cover prescriptions for patient until they are resubmitted under a new prescribing Provider  Please contact to further advise when possible >> May 04, 2020  1:07 PM Marylen Ponto wrote: Tresa Endo with [facility] called regarding an update on the request for another provider to provide Rx for patient prescriptions so insurance will cover. Cb# [phone]   Tresa Endo from [facility] has reached out again about patient prescriptions.

## 2020-05-05 NOTE — Telephone Encounter (Signed)
Zelda may u please send in new rxs for pt

## 2020-05-06 ENCOUNTER — Other Ambulatory Visit: Payer: Self-pay

## 2020-05-06 NOTE — Telephone Encounter (Signed)
MEDICATIONS SENT.   

## 2020-05-07 ENCOUNTER — Other Ambulatory Visit: Payer: Self-pay

## 2020-05-10 ENCOUNTER — Other Ambulatory Visit: Payer: Self-pay

## 2020-05-10 ENCOUNTER — Other Ambulatory Visit: Payer: Self-pay | Admitting: *Deleted

## 2020-05-10 DIAGNOSIS — M545 Low back pain, unspecified: Secondary | ICD-10-CM

## 2020-05-10 DIAGNOSIS — E782 Mixed hyperlipidemia: Secondary | ICD-10-CM

## 2020-05-10 DIAGNOSIS — E039 Hypothyroidism, unspecified: Secondary | ICD-10-CM

## 2020-05-10 DIAGNOSIS — E119 Type 2 diabetes mellitus without complications: Secondary | ICD-10-CM

## 2020-05-10 DIAGNOSIS — F411 Generalized anxiety disorder: Secondary | ICD-10-CM

## 2020-05-10 DIAGNOSIS — J309 Allergic rhinitis, unspecified: Secondary | ICD-10-CM

## 2020-05-10 DIAGNOSIS — K219 Gastro-esophageal reflux disease without esophagitis: Secondary | ICD-10-CM

## 2020-05-10 MED ORDER — LANSOPRAZOLE 30 MG PO CPDR: DELAYED_RELEASE_CAPSULE | ORAL | 3 refills | Status: DC

## 2020-05-10 MED ORDER — ACCU-CHEK FASTCLIX LANCETS MISC: 11 refills | Status: DC

## 2020-05-10 MED ORDER — ROSUVASTATIN CALCIUM 10 MG PO TABS: ORAL_TABLET | ORAL | 3 refills | Status: DC

## 2020-05-10 MED ORDER — CETIRIZINE HCL 10 MG PO TABS: 10.0000 mg | ORAL_TABLET | Freq: Every day | ORAL | 11 refills | Status: DC

## 2020-05-10 MED ORDER — ACCU-CHEK GUIDE VI STRP: ORAL_STRIP | 12 refills | Status: DC

## 2020-05-10 MED ORDER — TRAZODONE HCL 50 MG PO TABS: 25.0000 mg | ORAL_TABLET | Freq: Every evening | ORAL | 3 refills | Status: DC | PRN

## 2020-05-10 MED ORDER — TRIAMCINOLONE ACETONIDE 0.025 % EX OINT: 1.0000 "application " | TOPICAL_OINTMENT | Freq: Two times a day (BID) | CUTANEOUS | 0 refills | Status: AC

## 2020-05-10 MED ORDER — SERTRALINE HCL 50 MG PO TABS: 50.0000 mg | ORAL_TABLET | Freq: Every day | ORAL | 1 refills | Status: DC

## 2020-05-10 MED ORDER — METFORMIN HCL 500 MG PO TABS: ORAL_TABLET | ORAL | 0 refills | Status: DC

## 2020-05-10 MED ORDER — LEVOTHYROXINE SODIUM 125 MCG PO TABS: ORAL_TABLET | ORAL | 0 refills | Status: DC

## 2020-05-10 MED ORDER — ACCU-CHEK FASTCLIX LANCET KIT: PACK | 11 refills | Status: DC

## 2020-05-10 NOTE — Progress Notes (Signed)
Request forward Rx to [facility]- sent to wrong pharamcy

## 2020-05-10 NOTE — Telephone Encounter (Signed)
Requested medication (s) are due for refill today - Rx sent to wrong pharmacy- please forward   Requested medication (s) are on the active medication list - yes  Future visit scheduled -yes  Last refill: Rx sent to incorrect pharmacy  Notes to clinic: Non delegated Rx - needs to be forwarded to [facility] for patient   Requested Prescriptions  Pending Prescriptions Disp Refills   methocarbamol (ROBAXIN) 500 MG tablet 60 tablet 2    Sig: Take 1 tablet (500 mg total) by mouth every 8 (eight) hours as needed for muscle spasms.      Not Delegated - Analgesics:  Muscle Relaxants Failed - 05/10/2020 10:08 AM      Failed - This refill cannot be delegated      Passed - Valid encounter within last 6 months    Recent Outpatient Visits           5 months ago Type 2 diabetes mellitus without complication, without long-term current use of insulin ([place])   [place] Fulp, Kilgore, MD   1 year ago Birth control counseling   [place], Nicolaus, [place]   1 year ago Encounter for long-term (current) use of medications   [place]Novel[place], Chaparral, MD   1 year ago Shuqualak, Colorado J, NP   1 year ago Type 2 diabetes mellitus without complication, without long-term current use of insulin ([place])   [place] Fulp, Sierra Blanca, MD       Future Appointments             In 2 days Gildardo Pounds, NP [place]              Signed Prescriptions Disp Refills   Accu-Chek FastClix Lancets MISC 100 each 11    Sig: Use as directed to check blood sugars once per day      Endocrinology: Diabetes - Testing Supplies Passed - 05/10/2020 10:08 AM      Passed - Valid encounter within last 12 months    Recent Outpatient Visits           5 months ago Type 2 diabetes mellitus without complication, without  long-term current use of insulin ([place])   Cameron Stacy, Annona, MD   1 year ago Birth control counseling   [place] Alliance, Vivian, [place]   1 year ago Encounter for long-term (current) use of medications   Laurel, MD   1 year ago Emerson, Colorado J, NP   1 year ago Type 2 diabetes mellitus without complication, without long-term current use of insulin ([place])   Veyo, MD       Future Appointments             In 2 days Gildardo Pounds, NP [place]               cetirizine (ZYRTEC) 10 MG tablet 30 tablet 11    Sig: Take 1 tablet (10 mg total) by mouth daily. At bedtime to help with congestion      Ear, Nose, and Throat:  Antihistamines Passed - 05/10/2020 10:08 AM  Passed - Valid encounter within last 12 months    Recent Outpatient Visits           5 months ago Type 2 diabetes mellitus without complication, without long-term current use of insulin ([place])   [place] Fulp, Conroy, MD   1 year ago Birth control counseling   [place]Ourse, Onekama, [place]   1 year ago Encounter for long-term (current) use of medications   [place] Fulp, Nashville, MD   1 year ago Quincy, Colorado J, NP   1 year ago Type 2 diabetes mellitus without complication, without long-term current use of insulin ([place])   [place] Fulp, Sixteen Mile Stand, MD       Future Appointments             In 2 days Gildardo Pounds, NP [place]               glucose blood (ACCU-CHEK GUIDE) test strip 100 each 12    Sig: Use as instructed to check blood sugars  once per day      Endocrinology: Diabetes - Testing Supplies Passed - 05/10/2020 10:08 AM      Passed - Valid encounter within last 12 months    Recent Outpatient Visits           5 months ago Type 2 diabetes mellitus without complication, without long-term current use of insulin ([place])   [place] Fulp, Boston, MD   1 year ago Birth control counseling   Chance Quinter, Hedwig Village, [place]   1 year ago Encounter for long-term (current) use of medications   [place] Ames, Giltner, MD   1 year ago Holt, Colorado J, NP   1 year ago Type 2 diabetes mellitus without complication, without long-term current use of insulin ([place])   [place] Fulp, Midland, MD       Future Appointments             In 2 days Gildardo Pounds, NP [place]               lansoprazole (PREVACID) 30 MG capsule 90 capsule 3    Sig: TAKE 1 CAPSULE BY MOUTH ONCE DAILY AT NOON TO REDUCE STOMACH ACID      Gastroenterology: Proton Pump Inhibitors Passed - 05/10/2020 10:08 AM      Passed - Valid encounter within last 12 months    Recent Outpatient Visits           5 months ago Type 2 diabetes mellitus without complication, without long-term current use of insulin ([place])   [place] Fulp, Valley Springs, MD   1 year ago Birth control counseling   [place]un[place] Hauser, Weatherby Lake, [place]   1 year ago Encounter for long-term (current) use of medications   Laval[place] Rincon, Ayr, MD   1 year ago Savage, Colorado J, NP   1 year ago Type 2 diabetes mellitus without complication, without long-term current use of insulin ([place])   [place]  [facility] Antony Blackbird,  MD       Future Appointments             In 2 days Gildardo Pounds, NP [place]               levothyroxine (SYNTHROID) 125 MCG tablet 30 tablet 0    Sig: TAKE 1 TABLET BY MOUTH ONCE DAILY BEFORE BREAKFAST      Endocrinology:  Hypothyroid Agents Failed - 05/10/2020 10:08 AM      Failed - TSH needs to be rechecked within 3 months after an abnormal result. Refill until TSH is due.      Passed - TSH in normal range and within 360 days    TSH  Date Value Ref Range Status  11/21/2019 1.530 0.450 - 4.500 uIU/mL Final          Passed - Valid encounter within last 12 months    Recent Outpatient Visits           5 months ago Type 2 diabetes mellitus without complication, without long-term current use of insulin ([place])   [place] Heights, Westbrook, MD   1 year ago Birth control counseling   [place] Reid Hope King, Aspinwall, [place]   1 year ago Encounter for long-term (current) use of medications   [place] Camino Tassajara, Millington, MD   1 year ago Kings Park, Amy J, NP   1 year ago Type 2 diabetes mellitus without complication, without long-term current use of insulin ([place])   [place]os [place]leville, Lake Royale, MD       Future Appointments             In 2 days Gildardo Pounds, NP [place]               metFORMIN (GLUCOPHAGE) 500 MG tablet 30 tablet 0    Sig: TAKE 1 TABLET BY MOUTH ONCE DAILY AFTER SUPPER      Endocrinology:  Diabetes - Biguanides Passed - 05/10/2020 10:08 AM      Passed - Cr in normal range and within 360 days    Creatinine, Ser  Date Value Ref Range Status  11/21/2019 0.85 0.57 - 1.00 mg/dL Final   Creatinine, Urine  Date Value Ref Range Status  05/10/2016 83.00 mg/dL Final          Passed - HBA1C is between 0 and 7.9 and within  180 days    Hemoglobin A1C  Date Value Ref Range Status  11/21/2019 6.2 (A) 4.0 - 5.6 % Final   HbA1c, POC (controlled diabetic range)  Date Value Ref Range Status  10/31/2017 6.6 0.0 - 7.0 % Final   Hgb A1c MFr Bld  Date Value Ref Range Status  03/26/2019 7.1 (H) 4.8 - 5.6 % Final    Comment:             Prediabetes: 5.7 - 6.4          Diabetes: >6.4          Glycemic control for adults with diabetes: <7.0           Passed - eGFR in normal range and within 360 days    GFR calc Af Amer  Date Value Ref Range Status  11/21/2019 101 >59 mL/min/1.73 Final  Comment:    **In accordance with recommendations from the NKF-ASN Task force,**   [facility] is in the process of updating its eGFR calculation to the   2021 CKD-EPI creatinine equation that estimates kidney function   without a race variable.    GFR calc non Af Amer  Date Value Ref Range Status  11/21/2019 88 >59 mL/min/1.73 Final          Passed - Valid encounter within last 6 months    Recent Outpatient Visits           5 months ago Type 2 diabetes mellitus without complication, without long-term current use of insulin ([place])   [place] Chester, Bruneau, MD   1 year ago Birth control counseling   [place], Coyanosa, [place]   1 year ago Encounter for long-term (current) use of medications   Frank[place] North Bend, Big Lake, MD   1 year ago Bluff City, Colorado J, NP   1 year ago Type 2 diabetes mellitus without complication, without long-term current use of insulin ([place])   Council, MD       Future Appointments             In 2 days Gildardo Pounds, NP [place]               rosuvastatin (CRESTOR) 10 MG tablet 90 tablet 3    Sig: TAKE 1 TABLET BY MOUTH ONCE DAILY TO  LOWER  CHOLESTROL       Cardiovascular:  Antilipid - Statins Failed - 05/10/2020 10:08 AM      Failed - Total Cholesterol in normal range and within 360 days    Cholesterol, Total  Date Value Ref Range Status  03/26/2019 107 100 - 199 mg/dL Final          Failed - LDL in normal range and within 360 days    LDL Chol Calc (NIH)  Date Value Ref Range Status  03/26/2019 46 0 - 99 mg/dL Final          Failed - HDL in normal range and within 360 days    HDL  Date Value Ref Range Status  03/26/2019 38 (L) >39 mg/dL Final          Failed - Triglycerides in normal range and within 360 days    Triglycerides  Date Value Ref Range Status  03/26/2019 126 0 - 149 mg/dL Final          Passed - Patient is not pregnant      Passed - Valid encounter within last 12 months    Recent Outpatient Visits           5 months ago Type 2 diabetes mellitus without complication, without long-term current use of insulin ([place])   [facility] Fulp, Barnard, MD   1 year ago Birth control counseling   Ha[place] Markham, Kimball, [place]   1 year ago Encounter for long-term (current) use of medications   Cone Lanesboro, MD   1 year ago Spring House, Colorado J, NP   1 year ago Type 2 diabetes mellitus without complication, without long-term current use of insulin ([place])  North Woodstock, MD       Future Appointments             In 2 days Gildardo Pounds, NP Graham               sertraline (ZOLOFT) 50 MG tablet 90 tablet 1    Sig: Take 1 tablet (50 mg total) by mouth daily.      Psychiatry:  Antidepressants - SSRI Passed - 05/10/2020 10:08 AM      Passed - Completed PHQ-2 or PHQ-9 in the last 360 days      Passed - Valid encounter within last 6 months    Recent Outpatient Visits           5  months ago Type 2 diabetes mellitus without complication, without long-term current use of insulin ([place])   [place]ad[place] Fulp, Deloit, MD   1 year ago Birth control counseling   [place] Mullens, Lawrence, [place]   1 year ago Encounter for long-term (current) use of medications   [place]aced[place] Mitchell, Elroy, MD   1 year ago Plainville, Colorado J, NP   1 year ago Type 2 diabetes mellitus without complication, without long-term current use of insulin ([place])   [place] Coker, Walker, MD       Future Appointments             In 2 days Gildardo Pounds, NP [place]               traZODone (DESYREL) 50 MG tablet 90 tablet 3    Sig: Take 0.5-1 tablets (25-50 mg total) by mouth at bedtime as needed for sleep.      Psychiatry: Antidepressants - Serotonin Modulator Passed - 05/10/2020 10:08 AM      Passed - Completed PHQ-2 or PHQ-9 in the last 360 days      Passed - Valid encounter within last 6 months    Recent Outpatient Visits           5 months ago Type 2 diabetes mellitus without complication, without long-term current use of insulin ([place])   [place] Fulp, West End, MD   1 year ago Birth control counseling   Summit Brazos, Holiday, [place]   1 year ago Encounter for long-term (current) use of medications   Brighton Paige, Henderson, MD   1 year ago Hornitos, Colorado J, NP   1 year ago Type 2 diabetes mellitus without complication, without long-term current use of insulin (Ma[place])   Houghton, MD       Future Appointments             In 2 days Gildardo Pounds, NP [place]               triamcinolone (KENALOG) 0.025 % ointment 30 g 0    Sig: Apply 1 application topically 2 (two) times daily. Apply to right skin lesion      Dermatology:  Corticosteroids Passed - 05/10/2020 10:08 AM      Passed - Valid encounter  within last 12 months    Recent Outpatient Visits           5 months ago Type 2 diabetes mellitus without complication, without long-term current use of insulin ([place])   [place] Fulp, New Richmond, MD   1 year ago Birth control counseling   K[place] Osage, Martinsdale, [place]   1 year ago Encounter for long-term (current) use of medications   W[place] Palm River-Clair Mel, Dowell, MD   1 year ago Hibbing, Colorado J, NP   1 year ago Type 2 diabetes mellitus without complication, without long-term current use of insulin ([place])   [place] Fulp, Toftrees, MD       Future Appointments             In 2 days Gildardo Pounds, NP [place]              Refused Prescriptions Disp Refills   Blood Glucose Monitoring Suppl (ACCU-CHEK GUIDE ME) w/Device KIT 1 kit 0    Sig: 1 kit by Does not apply route daily. Use once per day to check blood sugars. E11.9      Endocrinology: Diabetes - Testing Supplies Passed - 05/10/2020 10:08 AM      Passed - Valid encounter within last 12 months    Recent Outpatient Visits           5 months ago Type 2 diabetes mellitus without complication, without long-term current use of insulin (Bear Lake)   [place] Fulp, Archer, MD   1 year ago Birth control counseling   Amanda Berry Creek, Bitter Springs, [place]   1 year ago Encounter for long-term (current) use of medications   Greens Landing Sunland Park, Hat Creek, MD   1 year ago Atoka, Colorado J, NP   1 year ago Type 2 diabetes mellitus without complication, without long-term current use of insulin (C[place])   Y[place] Center Baton Rouge, Butler, MD       Future Appointments             In 2 days Gildardo Pounds, NP [place]                 Requested Prescriptions  Pending Prescriptions Disp Refills   methocarbamol (ROBAXIN) 500 MG tablet 60 tablet 2    Sig: Take 1 tablet (500 mg total) by mouth every 8 (eight) hours as needed for muscle spasms.      Not Delegated - Analgesics:  Muscle Relaxants Failed - 05/10/2020 10:08 AM      Failed - This refill cannot be delegated      Passed - Valid encounter within last 6 months    Recent Outpatient Visits           5 months ago Type 2 diabetes mellitus without complication, without long-term current use of insulin ([place])   [place] Fulp, Naomi, MD   1 year ago Birth control counseling   [place] St. John, Rochester, [place]   1 year ago Encounter for long-term (current) use of medications   [facility] Sardis City, Amber, MD  1 year ago Edinburg, Colorado J, NP   1 year ago Type 2 diabetes mellitus without complication, without long-term current use of insulin (Monte[place] Park)   Ree Heights Fulp, West Bradenton, MD       Future Appointments             In 2 days Gildardo Pounds, NP Royse City              Signed Prescriptions Disp Refills   Accu-Chek FastClix Lancets MISC 100 each 11    Sig: Use as directed to check blood sugars once per day      Endocrinology: Diabetes - Testing Supplies Passed - 05/10/2020 10:08 AM      Passed - Valid encounter within last 12 months    Recent Outpatient Visits           5 months ago Type 2 diabetes  mellitus without complication, without long-term current use of insulin ([place])   Hampton Arnold Line, New Deal, MD   1 year ago Birth control counseling   Cruger Peridot, White Settlement, [place]   1 year ago Encounter for long-term (current) use of medications   Bristol, MD   1 year ago Hickory, Colorado J, NP   1 year ago Type 2 diabetes mellitus without complication, without long-term current use of insulin (Rome)   Ryan, MD       Future Appointments             In 2 days Gildardo Pounds, NP [place]               cetirizine (ZYRTEC) 10 MG tablet 30 tablet 11    Sig: Take 1 tablet (10 mg total) by mouth daily. At bedtime to help with congestion      Ear, Nose, and Throat:  Antihistamines Passed - 05/10/2020 10:08 AM      Passed - Valid encounter within last 12 months    Recent Outpatient Visits           5 months ago Type 2 diabetes mellitus without complication, without long-term current use of insulin ([place])   Pe[place] Corning, Waggaman, MD   1 year ago Birth control counseling   [place]ers[place] Cottage City, Hidalgo, [place]   1 year ago Encounter for long-term (current) use of medications   Ollie Riverdale, Flandreau, MD   1 year ago Pontoon Beach, Colorado J, NP   1 year ago Type 2 diabetes mellitus without complication, without long-term current use of insulin ([place])   [place]in[place] Fulp, Emigration Canyon, MD       Future Appointments             In 2 days Gildardo Pounds, NP [place]keley               glucose blood (ACCU-CHEK GUIDE) test strip 100 each 12    Sig: Use  as instructed to check blood sugars once per day      Endocrinology: Diabetes - Testing Supplies Passed - 05/10/2020  10:08 AM      Passed - Valid encounter within last 12 months    Recent Outpatient Visits           5 months ago Type 2 diabetes mellitus without complication, without long-term current use of insulin ([place])   [place] Fulp, Huachuca City, MD   1 year ago Birth control counseling   Indian[place] Treynor, Mathews, [place]   1 year ago Encounter for long-term (current) use of medications   [place]hen Fulp, White Cloud, MD   1 year ago Keedysville, Colorado J, NP   1 year ago Type 2 diabetes mellitus without complication, without long-term current use of insulin (B[place])   H[place]ville Fulp, Roosevelt, MD       Future Appointments             In 2 days Gildardo Pounds, NP Fisher               lansoprazole (PREVACID) 30 MG capsule 90 capsule 3    Sig: TAKE 1 CAPSULE BY MOUTH ONCE DAILY AT NOON TO REDUCE STOMACH ACID      Gastroenterology: Proton Pump Inhibitors Passed - 05/10/2020 10:08 AM      Passed - Valid encounter within last 12 months    Recent Outpatient Visits           5 months ago Type 2 diabetes mellitus without complication, without long-term current use of insulin ([place])   Browning Fulp, Belknap, MD   1 year ago Birth control counseling   Langdon Round Lake, Benton Heights, [place]   1 year ago Encounter for long-term (current) use of medications   Vallec[place] Tony, Eagle Creek, MD   1 year ago East Franklin, Colorado J, NP   1 year ago Type 2 diabetes mellitus without complication, without long-term current use of insulin (Sh[place])   [place] Hartland Fulp, Big Foot Prairie, MD       Future Appointments             In 2 days Gildardo Pounds, NP [place]               levothyroxine (SYNTHROID) 125 MCG tablet 30 tablet 0    Sig: TAKE 1 TABLET BY MOUTH ONCE DAILY BEFORE BREAKFAST      Endocrinology:  Hypothyroid Agents Failed - 05/10/2020 10:08 AM      Failed - TSH needs to be rechecked within 3 months after an abnormal result. Refill until TSH is due.      Passed - TSH in normal range and within 360 days    TSH  Date Value Ref Range Status  11/21/2019 1.530 0.450 - 4.500 uIU/mL Final          Passed - Valid encounter within last 12 months    Recent Outpatient Visits           5 months ago Type 2 diabetes mellitus without complication, without long-term current use of insulin (Ham[place])   Harvey Fulp, Jansen, MD   1 year ago Birth control counseling   Bed[place] Westmont, Kaibab Estates West, [place]  1 year ago Encounter for long-term (current) use of medications   [place] Elmore, Oil City, MD   1 year ago Delleker, Colorado J, NP   1 year ago Type 2 diabetes mellitus without complication, without long-term current use of insulin (G[place])   Los[place] Argenta, Galt, MD       Future Appointments             In 2 days Gildardo Pounds, NP McKinney               metFORMIN (GLUCOPHAGE) 500 MG tablet 30 tablet 0    Sig: TAKE 1 TABLET BY MOUTH ONCE DAILY AFTER SUPPER      Endocrinology:  Diabetes - Biguanides Passed - 05/10/2020 10:08 AM      Passed - Cr in normal range and within 360 days    Creatinine, Ser  Date Value Ref Range Status  11/21/2019 0.85 0.57 - 1.00 mg/dL Final   Creatinine, Urine  Date Value Ref Range Status  05/10/2016 83.00 mg/dL Final          Passed - HBA1C  is between 0 and 7.9 and within 180 days    Hemoglobin A1C  Date Value Ref Range Status  11/21/2019 6.2 (A) 4.0 - 5.6 % Final   HbA1c, POC (controlled diabetic range)  Date Value Ref Range Status  10/31/2017 6.6 0.0 - 7.0 % Final   Hgb A1c MFr Bld  Date Value Ref Range Status  03/26/2019 7.1 (H) 4.8 - 5.6 % Final    Comment:             Prediabetes: 5.7 - 6.4          Diabetes: >6.4          Glycemic control for adults with diabetes: <7.0           Passed - eGFR in normal range and within 360 days    GFR calc Af Amer  Date Value Ref Range Status  11/21/2019 101 >59 mL/min/1.73 Final    Comment:    **In accordance with recommendations from the NKF-ASN Task force,**   [facility] is in the process of updating its eGFR calculation to the   2021 CKD-EPI creatinine equation that estimates kidney function   without a race variable.    GFR calc non Af Amer  Date Value Ref Range Status  11/21/2019 88 >59 mL/min/1.73 Final          Passed - Valid encounter within last 6 months    Recent Outpatient Visits           5 months ago Type 2 diabetes mellitus without complication, without long-term current use of insulin ([place])   [place] Fulp, Pigeon Forge, MD   1 year ago Birth control counseling   [place] River Hills, Bellbrook, [place]   1 year ago Encounter for long-term (current) use of medications   [facility] Marlow Heights, White Plains, MD   1 year ago Homer, Colorado J, NP   1 year ago Type 2 diabetes mellitus without complication, without long-term current use of insulin ([place])   Diamond Beach Antony Blackbird, MD       Future Appointments  In 2 days Gildardo Pounds, NP [place]               rosuvastatin (CRESTOR) 10 MG tablet 90 tablet 3    Sig: TAKE 1 TABLET BY MOUTH ONCE  DAILY TO  LOWER  CHOLESTROL      Cardiovascular:  Antilipid - Statins Failed - 05/10/2020 10:08 AM      Failed - Total Cholesterol in normal range and within 360 days    Cholesterol, Total  Date Value Ref Range Status  03/26/2019 107 100 - 199 mg/dL Final          Failed - LDL in normal range and within 360 days    LDL Chol Calc (NIH)  Date Value Ref Range Status  03/26/2019 46 0 - 99 mg/dL Final          Failed - HDL in normal range and within 360 days    HDL  Date Value Ref Range Status  03/26/2019 38 (L) >39 mg/dL Final          Failed - Triglycerides in normal range and within 360 days    Triglycerides  Date Value Ref Range Status  03/26/2019 126 0 - 149 mg/dL Final          Passed - Patient is not pregnant      Passed - Valid encounter within last 12 months    Recent Outpatient Visits           5 months ago Type 2 diabetes mellitus without complication, without long-term current use of insulin ([place])   [place] Fulp, Homer, MD   1 year ago Birth control counseling   [place] Morganville, Ten[place], [place]   1 year ago Encounter for long-term (current) use of medications   [place] Centerville, Modoc, MD   1 year ago Vine Grove, Colorado J, NP   1 year ago Type 2 diabetes mellitus without complication, without long-term current use of insulin ([place])   Dak[place] Dunes Vienna, Lorimor, MD       Future Appointments             In 2 days Gildardo Pounds, NP [place]               sertraline (ZOLOFT) 50 MG tablet 90 tablet 1    Sig: Take 1 tablet (50 mg total) by mouth daily.      Psychiatry:  Antidepressants - SSRI Passed - 05/10/2020 10:08 AM      Passed - Completed PHQ-2 or PHQ-9 in the last 360 days      Passed - Valid encounter within last 6 months    Recent  Outpatient Visits           5 months ago Type 2 diabetes mellitus without complication, without long-term current use of insulin (H[place])   Straw[place] Fulp, Bartonsville, MD   1 year ago Birth control counseling   [place] Fordsville, Gregory, [place]   1 year ago Encounter for long-term (current) use of medications   Angels, MD   1 year ago Milltown, Connecticut, NP   1 year ago Type 2  diabetes mellitus without complication, without long-term current use of insulin ([place])   Kane, MD       Future Appointments             In 2 days Gildardo Pounds, NP [place]               traZODone (DESYREL) 50 MG tablet 90 tablet 3    Sig: Take 0.5-1 tablets (25-50 mg total) by mouth at bedtime as needed for sleep.      Psychiatry: Antidepressants - Serotonin Modulator Passed - 05/10/2020 10:08 AM      Passed - Completed PHQ-2 or PHQ-9 in the last 360 days      Passed - Valid encounter within last 6 months    Recent Outpatient Visits           5 months ago Type 2 diabetes mellitus without complication, without long-term current use of insulin ([place])   [place] Fulp, [place], MD   1 year ago Birth control counseling   [place], [place], [place]   1 year ago Encounter for long-term (current) use of medications   [place] Denton, [place], MD   1 year ago [place], [place] J, NP   1 year ago Type 2 diabetes mellitus without complication, without long-term current use of insulin ([place])   Bartholomew, MD       Future Appointments             In 2 days Gildardo Pounds, NP  [place]               triamcinolone (KENALOG) 0.025 % ointment 30 g 0    Sig: Apply 1 application topically 2 (two) times daily. Apply to right skin lesion      Dermatology:  Corticosteroids Passed - 05/10/2020 10:08 AM      Passed - Valid encounter within last 12 months    Recent Outpatient Visits           5 months ago Type 2 diabetes mellitus without complication, without long-term current use of insulin ([place])   [place] Fulp, [place], MD   1 year ago Birth control counseling   [place], [place], [place]   1 year ago Encounter for long-term (current) use of medications   [place], [place], MD   1 year ago [place], [place] J, NP   1 year ago Type 2 diabetes mellitus without complication, without long-term current use of insulin ([place])   [place] Fulp, [place], MD       Future Appointments             In 2 days Gildardo Pounds, NP [place]              Refused Prescriptions Disp Refills   Blood Glucose Monitoring Suppl (ACCU-CHEK GUIDE ME) w/Device KIT 1 kit 0    Sig: 1 kit by Does not apply route daily. Use once per day to check blood sugars. E11.9      Endocrinology: Diabetes - Testing Supplies Passed -  05/10/2020 10:08 AM      Passed - Valid encounter within last 12 months    Recent Outpatient Visits           5 months ago Type 2 diabetes mellitus without complication, without long-term current use of insulin ([place])   [place] Fulp, [place], MD   1 year ago Birth control counseling   [place], [place], [place]   1 year ago Encounter for long-term (current) use of medications   [place], [place], MD   1  year ago [place], [place] J, NP   1 year ago Type 2 diabetes mellitus without complication, without long-term current use of insulin ([place])   [place] Antony Blackbird, MD       Future Appointments             In 2 days Gildardo Pounds, NP [place]

## 2020-05-10 NOTE — Telephone Encounter (Signed)
Copied from [place] [id number]. Topic: Quick Communication - Rx Refill/Question >> May 10, 2020  9:41 AM Tessa Lerner A wrote: Medication: [id number]  Accu-Chek FastClix Lancets MISC   [id number]  cetirizine (ZYRTEC) 10 MG tablet   [id number]  glucose blood (ACCU-CHEK GUIDE) test strip   [id number]  Lancets Misc. (ACCU-CHEK FASTCLIX LANCET) KIT   [id number]  lansoprazole (PREVACID) 30 MG capsule   [id number]  levothyroxine (SYNTHROID) 125 MCG tablet   [id number]  metFORMIN (GLUCOPHAGE) 500 MG tablet  [id number]  methocarbamol (ROBAXIN) 500 MG tablet   [id number]  rosuvastatin (CRESTOR) 10 MG tablet   [id number]  sertraline (ZOLOFT) 50 MG tablet  [id number]  traZODone (DESYREL) 50 MG tablet   [id number]  triamcinolone (KENALOG) 0.025 % ointment    Has the patient contacted their pharmacy? Yes. Patient has spoken with their pharmacy previously. The medications were submitted to the wrong pharmacy previously.  Preferred Pharmacy (with phone number or street name): [facility], [address]  Phone:  [phone]   Agent: Please be advised that RX refills may take up to 3 business days. We ask that you follow-up with your pharmacy.

## 2020-05-10 NOTE — Telephone Encounter (Signed)
Patient called - all Rx sent to incorrect pharmacy Rx forwarded per patient request. Controlled Rx sent to office for forwarding

## 2020-05-11 MED ORDER — METHOCARBAMOL 500 MG PO TABS: 500.0000 mg | ORAL_TABLET | Freq: Three times a day (TID) | ORAL | 0 refills | Status: DC | PRN

## 2020-05-12 ENCOUNTER — Ambulatory Visit: Payer: Medicaid Other | Admitting: Nurse Practitioner

## 2020-05-21 ENCOUNTER — Other Ambulatory Visit: Payer: Self-pay

## 2020-06-04 ENCOUNTER — Ambulatory Visit (HOSPITAL_COMMUNITY)
Admission: EM | Admit: 2020-06-04 | Discharge: 2020-06-04 | Disposition: A | Discharge status: Home / Self Care | Payer: Medicaid Other | Attending: Emergency Medicine | Admitting: Emergency Medicine

## 2020-06-04 ENCOUNTER — Other Ambulatory Visit: Payer: Self-pay

## 2020-06-04 DIAGNOSIS — Z20822 Contact with and (suspected) exposure to covid-19: Secondary | ICD-10-CM | POA: Insufficient documentation

## 2020-06-05 LAB — SARS CORONAVIRUS 2 (TAT 6-24 HRS): SARS Coronavirus 2: NEGATIVE

## 2020-06-17 ENCOUNTER — Ambulatory Visit (HOSPITAL_COMMUNITY)
Admission: EM | Admit: 2020-06-17 | Discharge: 2020-06-17 | Disposition: A | Discharge status: Home / Self Care | Payer: Medicaid Other | Attending: Internal Medicine | Admitting: Internal Medicine

## 2020-06-17 ENCOUNTER — Other Ambulatory Visit: Payer: Self-pay

## 2020-06-17 ENCOUNTER — Encounter (HOSPITAL_COMMUNITY): Payer: Self-pay

## 2020-06-17 DIAGNOSIS — T148XXA Other injury of unspecified body region, initial encounter: Secondary | ICD-10-CM | POA: Diagnosis not present

## 2020-06-17 LAB — POCT URINALYSIS DIPSTICK, ED / UC
Bilirubin Urine: NEGATIVE
Glucose, UA: NEGATIVE mg/dL
Hgb urine dipstick: NEGATIVE
Ketones, ur: NEGATIVE mg/dL
Leukocytes,Ua: NEGATIVE
Nitrite: NEGATIVE
Protein, ur: NEGATIVE mg/dL
Specific Gravity, Urine: 1.01 (ref 1.005–1.030)
Urobilinogen, UA: 0.2 mg/dL (ref 0.0–1.0)
pH: 5 (ref 5.0–8.0)

## 2020-06-17 MED ORDER — KETOROLAC TROMETHAMINE 30 MG/ML IJ SOLN
INTRAMUSCULAR | Status: AC
  Filled 2020-06-17: qty 1

## 2020-06-17 MED ORDER — KETOROLAC TROMETHAMINE 30 MG/ML IJ SOLN
30.0000 mg | Freq: Once | INTRAMUSCULAR | Status: AC
  Administered 2020-06-17: 30 mg via INTRAMUSCULAR

## 2020-06-17 NOTE — ED Triage Notes (Addendum)
Pt in with c/o lower back pain that has been going on for a few days. Pt states it feels like she has kidney infection  Pt has not been taking medication for sx  Pt states when she urinated the pain eased up
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
## 2020-06-17 NOTE — Discharge Instructions (Addendum)
Your low back pain is likely due to muscle strain.  You have been given a shot of anti-inflammatory at urgent care today.  Please do not take any Motrin or ibuprofen until tomorrow.  At that time you can take every 6 hours as needed.  Gentle stretching as we discussed.

## 2020-07-01 ENCOUNTER — Other Ambulatory Visit: Payer: Self-pay | Admitting: Nurse Practitioner

## 2020-07-01 DIAGNOSIS — E039 Hypothyroidism, unspecified: Secondary | ICD-10-CM

## 2020-07-01 NOTE — Telephone Encounter (Signed)
   Notes to clinic:  Patient has appt on 07/20/2020 Review for refill  Patient is due for labs and medication last filled on 05/13/2020 for 30 day    Requested Prescriptions  Pending Prescriptions Disp Refills   EUTHYROX 125 MCG tablet [Pharmacy Med Name: Euthyrox 125 MCG Oral Tablet] 30 tablet 0    Sig: TAKE 1 TABLET BY MOUTH ONCE DAILY BEFORE BREAKFAST      Endocrinology:  Hypothyroid Agents Failed - 07/01/2020  9:06 AM      Failed - TSH needs to be rechecked within 3 months after an abnormal result. Refill until TSH is due.      Passed - TSH in normal range and within 360 days    TSH  Date Value Ref Range Status  11/21/2019 1.530 0.450 - 4.500 uIU/mL Final          Passed - Valid encounter within last 12 months    Recent Outpatient Visits           7 months ago Type 2 diabetes mellitus without complication, without long-term current use of insulin ([facility])   [facility] Fulp, [place], MD   1 year ago Birth control counseling   [facility] [place], [place], [place]   1 year ago Encounter for long-term (current) use of medications   [facility] [place], [place], MD   1 year ago Dysuria   [facility] [place], [place] J, NP   1 year ago Type 2 diabetes mellitus without complication, without long-term current use of insulin ([facility])    [facility] [place], MD       Future Appointments             In 2 weeks Claiborne Rigg, NP [facility]

## 2020-07-20 ENCOUNTER — Other Ambulatory Visit: Payer: Self-pay

## 2020-07-20 ENCOUNTER — Ambulatory Visit: Payer: Medicaid Other | Attending: Nurse Practitioner | Admitting: Nurse Practitioner

## 2020-07-20 ENCOUNTER — Encounter: Payer: Self-pay | Admitting: Nurse Practitioner

## 2020-07-20 VITALS — BP 109/74 | HR 72 | Temp 98.3°F | Wt 195.0 lb

## 2020-07-20 DIAGNOSIS — F411 Generalized anxiety disorder: Secondary | ICD-10-CM | POA: Diagnosis not present

## 2020-07-20 DIAGNOSIS — K219 Gastro-esophageal reflux disease without esophagitis: Secondary | ICD-10-CM | POA: Diagnosis not present

## 2020-07-20 DIAGNOSIS — D649 Anemia, unspecified: Secondary | ICD-10-CM

## 2020-07-20 DIAGNOSIS — E119 Type 2 diabetes mellitus without complications: Secondary | ICD-10-CM | POA: Diagnosis not present

## 2020-07-20 DIAGNOSIS — G8929 Other chronic pain: Secondary | ICD-10-CM

## 2020-07-20 DIAGNOSIS — Z5900 Homelessness unspecified: Secondary | ICD-10-CM | POA: Diagnosis not present

## 2020-07-20 DIAGNOSIS — R0982 Postnasal drip: Secondary | ICD-10-CM | POA: Insufficient documentation

## 2020-07-20 DIAGNOSIS — Z79899 Other long term (current) drug therapy: Secondary | ICD-10-CM | POA: Diagnosis not present

## 2020-07-20 DIAGNOSIS — F4310 Post-traumatic stress disorder, unspecified: Secondary | ICD-10-CM | POA: Diagnosis not present

## 2020-07-20 DIAGNOSIS — E785 Hyperlipidemia, unspecified: Secondary | ICD-10-CM

## 2020-07-20 DIAGNOSIS — J309 Allergic rhinitis, unspecified: Secondary | ICD-10-CM

## 2020-07-20 DIAGNOSIS — F17210 Nicotine dependence, cigarettes, uncomplicated: Secondary | ICD-10-CM | POA: Diagnosis not present

## 2020-07-20 DIAGNOSIS — R519 Headache, unspecified: Secondary | ICD-10-CM | POA: Diagnosis not present

## 2020-07-20 DIAGNOSIS — J3489 Other specified disorders of nose and nasal sinuses: Secondary | ICD-10-CM | POA: Diagnosis not present

## 2020-07-20 DIAGNOSIS — E039 Hypothyroidism, unspecified: Secondary | ICD-10-CM | POA: Diagnosis not present

## 2020-07-20 DIAGNOSIS — F1290 Cannabis use, unspecified, uncomplicated: Secondary | ICD-10-CM | POA: Diagnosis not present

## 2020-07-20 DIAGNOSIS — F32A Depression, unspecified: Secondary | ICD-10-CM | POA: Insufficient documentation

## 2020-07-20 DIAGNOSIS — M545 Low back pain, unspecified: Secondary | ICD-10-CM

## 2020-07-20 DIAGNOSIS — Z7984 Long term (current) use of oral hypoglycemic drugs: Secondary | ICD-10-CM | POA: Insufficient documentation

## 2020-07-20 MED ORDER — FLUTICASONE PROPIONATE 50 MCG/ACT NA SUSP: 2.0000 | Freq: Every day | NASAL | 6 refills | Status: DC

## 2020-07-20 MED ORDER — ACCU-CHEK FASTCLIX LANCET KIT: PACK | 11 refills | Status: AC

## 2020-07-20 MED ORDER — TRAZODONE HCL 50 MG PO TABS: 25.0000 mg | ORAL_TABLET | Freq: Every evening | ORAL | 3 refills | Status: DC | PRN

## 2020-07-20 MED ORDER — LANSOPRAZOLE 30 MG PO CPDR: DELAYED_RELEASE_CAPSULE | ORAL | 3 refills | Status: DC

## 2020-07-20 MED ORDER — LEVOTHYROXINE SODIUM 125 MCG PO TABS: 125.0000 ug | ORAL_TABLET | Freq: Every day | ORAL | 0 refills | Status: DC

## 2020-07-20 MED ORDER — ROSUVASTATIN CALCIUM 10 MG PO TABS: ORAL_TABLET | ORAL | 3 refills | Status: DC

## 2020-07-20 MED ORDER — METHOCARBAMOL 500 MG PO TABS: 500.0000 mg | ORAL_TABLET | Freq: Three times a day (TID) | ORAL | 0 refills | Status: DC | PRN

## 2020-07-20 MED ORDER — ACCU-CHEK GUIDE VI STRP: ORAL_STRIP | 12 refills | Status: DC

## 2020-07-20 MED ORDER — CETIRIZINE HCL 10 MG PO TABS: 10.0000 mg | ORAL_TABLET | Freq: Every day | ORAL | 2 refills | Status: DC

## 2020-07-20 MED ORDER — SERTRALINE HCL 50 MG PO TABS: 50.0000 mg | ORAL_TABLET | Freq: Every day | ORAL | 1 refills | Status: DC

## 2020-07-20 MED ORDER — METFORMIN HCL 500 MG PO TABS: ORAL_TABLET | ORAL | 1 refills | Status: DC
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
## 2020-07-20 NOTE — Progress Notes (Signed)
Medication RF Cough for several months

## 2020-07-21 LAB — THYROID PANEL WITH TSH
Free Thyroxine Index: 2.1 (ref 1.2–4.9)
T3 Uptake Ratio: 27 % (ref 24–39)
T4, Total: 7.6 ug/dL (ref 4.5–12.0)
TSH: 4.04 u[IU]/mL (ref 0.450–4.500)

## 2020-07-21 LAB — CBC
Hematocrit: 37.1 % (ref 34.0–46.6)
Hemoglobin: 11.7 g/dL (ref 11.1–15.9)
MCH: 27 pg (ref 26.6–33.0)
MCHC: 31.5 g/dL (ref 31.5–35.7)
MCV: 86 fL (ref 79–97)
Platelets: 283 10*3/uL (ref 150–450)
RBC: 4.33 x10E6/uL (ref 3.77–5.28)
RDW: 13.6 % (ref 11.7–15.4)
WBC: 8 10*3/uL (ref 3.4–10.8)

## 2020-07-21 LAB — HEMOGLOBIN A1C
Est. average glucose Bld gHb Est-mCnc: 134 mg/dL
Hgb A1c MFr Bld: 6.3 % — ABNORMAL HIGH (ref 4.8–5.6)

## 2020-07-21 LAB — MICROALBUMIN / CREATININE URINE RATIO
Creatinine, Urine: 60.7 mg/dL
Microalb/Creat Ratio: 99 mg/g creat — ABNORMAL HIGH (ref 0–29)
Microalbumin, Urine: 60.3 ug/mL

## 2020-07-21 LAB — CMP14+EGFR
ALT: 17 IU/L (ref 0–32)
AST: 18 IU/L (ref 0–40)
Albumin/Globulin Ratio: 1.6 (ref 1.2–2.2)
Albumin: 4.7 g/dL (ref 3.8–4.8)
Alkaline Phosphatase: 105 IU/L (ref 44–121)
BUN/Creatinine Ratio: 9 (ref 9–23)
BUN: 8 mg/dL (ref 6–20)
Bilirubin Total: 0.3 mg/dL (ref 0.0–1.2)
CO2: 21 mmol/L (ref 20–29)
Calcium: 9.6 mg/dL (ref 8.7–10.2)
Chloride: 103 mmol/L (ref 96–106)
Creatinine, Ser: 0.86 mg/dL (ref 0.57–1.00)
Globulin, Total: 2.9 g/dL (ref 1.5–4.5)
Glucose: 152 mg/dL — ABNORMAL HIGH (ref 65–99)
Potassium: 4.6 mmol/L (ref 3.5–5.2)
Sodium: 139 mmol/L (ref 134–144)
Total Protein: 7.6 g/dL (ref 6.0–8.5)
eGFR: 89 mL/min/{1.73_m2} (ref >59)

## 2020-07-21 LAB — LIPID PANEL
Chol/HDL Ratio: 2.4 ratio (ref 0.0–4.4)
Cholesterol, Total: 128 mg/dL (ref 100–199)
HDL: 53 mg/dL (ref >39)
LDL Chol Calc (NIH): 51 mg/dL (ref 0–99)
Triglycerides: 142 mg/dL (ref 0–149)
VLDL Cholesterol Cal: 24 mg/dL (ref 5–40)

## 2020-07-26 ENCOUNTER — Encounter (INDEPENDENT_AMBULATORY_CARE_PROVIDER_SITE_OTHER): Payer: Self-pay | Admitting: *Deleted

## 2020-09-13 ENCOUNTER — Ambulatory Visit: Payer: Medicaid Other | Admitting: Nurse Practitioner

## 2020-10-08 ENCOUNTER — Other Ambulatory Visit (HOSPITAL_COMMUNITY)
Admission: RE | Admit: 2020-10-08 | Discharge: 2020-10-08 | Disposition: A | Discharge status: Home / Self Care | Payer: Medicaid Other | Source: Ambulatory Visit | Attending: Nurse Practitioner | Admitting: Nurse Practitioner

## 2020-10-08 ENCOUNTER — Ambulatory Visit: Payer: Medicaid Other | Attending: Nurse Practitioner | Admitting: Nurse Practitioner

## 2020-10-08 ENCOUNTER — Encounter: Payer: Self-pay | Admitting: Nurse Practitioner

## 2020-10-08 ENCOUNTER — Other Ambulatory Visit: Payer: Self-pay

## 2020-10-08 VITALS — BP 108/72 | HR 77 | Ht 63.5 in | Wt 192.1 lb

## 2020-10-08 DIAGNOSIS — Z114 Encounter for screening for human immunodeficiency virus [HIV]: Secondary | ICD-10-CM | POA: Insufficient documentation

## 2020-10-08 DIAGNOSIS — Z79899 Other long term (current) drug therapy: Secondary | ICD-10-CM | POA: Diagnosis not present

## 2020-10-08 DIAGNOSIS — Z23 Encounter for immunization: Secondary | ICD-10-CM

## 2020-10-08 DIAGNOSIS — Z124 Encounter for screening for malignant neoplasm of cervix: Secondary | ICD-10-CM | POA: Insufficient documentation

## 2020-10-08 DIAGNOSIS — Z7984 Long term (current) use of oral hypoglycemic drugs: Secondary | ICD-10-CM | POA: Diagnosis not present

## 2020-10-08 DIAGNOSIS — B373 Candidiasis of vulva and vagina: Secondary | ICD-10-CM | POA: Diagnosis not present

## 2020-10-08 DIAGNOSIS — Z01419 Encounter for gynecological examination (general) (routine) without abnormal findings: Secondary | ICD-10-CM | POA: Diagnosis not present

## 2020-10-08 DIAGNOSIS — B3731 Acute candidiasis of vulva and vagina: Secondary | ICD-10-CM

## 2020-10-08 MED ORDER — FLUCONAZOLE 150 MG PO TABS
150.0000 mg | ORAL_TABLET | Freq: Once | ORAL | 0 refills | Status: DC
Start: 2020-10-08 — End: 2020-10-08

## 2020-10-08 MED ORDER — FLUCONAZOLE 150 MG PO TABS
150.0000 mg | ORAL_TABLET | Freq: Once | ORAL | 0 refills | Status: AC
Start: 2020-10-08 — End: 2020-10-08
  Filled 2020-10-08: qty 1, 1d supply, fill #0
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
## 2020-10-09 LAB — HIV ANTIBODY (ROUTINE TESTING W REFLEX): HIV Screen 4th Generation wRfx: NONREACTIVE

## 2020-10-11 LAB — CERVICOVAGINAL ANCILLARY ONLY
Bacterial Vaginitis (gardnerella): NEGATIVE
Candida Glabrata: NEGATIVE
Candida Vaginitis: POSITIVE — AB
Chlamydia: NEGATIVE
Comment: NEGATIVE
Comment: NEGATIVE
Comment: NEGATIVE
Comment: NEGATIVE
Comment: NEGATIVE
Comment: NORMAL
Neisseria Gonorrhea: NEGATIVE
Trichomonas: NEGATIVE

## 2020-10-15 ENCOUNTER — Other Ambulatory Visit: Payer: Self-pay | Admitting: Nurse Practitioner

## 2020-10-15 ENCOUNTER — Other Ambulatory Visit: Payer: Self-pay

## 2020-10-15 DIAGNOSIS — R87610 Atypical squamous cells of undetermined significance on cytologic smear of cervix (ASC-US): Secondary | ICD-10-CM

## 2020-10-15 DIAGNOSIS — R87810 Cervical high risk human papillomavirus (HPV) DNA test positive: Secondary | ICD-10-CM

## 2020-10-15 LAB — CYTOLOGY - PAP
Comment: NEGATIVE
Comment: NEGATIVE
Diagnosis: UNDETERMINED — AB
HPV 16: NEGATIVE
HPV 18 / 45: NEGATIVE
High risk HPV: POSITIVE — AB

## 2020-10-18 NOTE — Progress Notes (Signed)
Called and informed the pt of results. Patient stated that she understood.

## 2020-10-26 ENCOUNTER — Telehealth: Payer: Self-pay | Admitting: Nurse Practitioner

## 2020-10-26 NOTE — Telephone Encounter (Signed)
Copied from CRM [id number]. Topic: General - Other >> Oct 21, 2020  3:54 PM Mary Ramirez wrote: Reason for CRM: Pt called in about the burning in her vaginal area, and wants to see about getting some medication, and possible infection, and wants to see about getting a urine test, please advise.

## 2020-10-27 NOTE — Telephone Encounter (Signed)
Called and spoke to Pt scheduled a tele for 10/30/2018. Pt will be in this week to give a urine sample.

## 2020-10-28 ENCOUNTER — Other Ambulatory Visit: Payer: Self-pay

## 2020-10-28 DIAGNOSIS — R350 Frequency of micturition: Secondary | ICD-10-CM

## 2020-10-28 LAB — POCT URINALYSIS DIP (CLINITEK)
Bilirubin, UA: NEGATIVE
Blood, UA: NEGATIVE
Glucose, UA: NEGATIVE mg/dL
Ketones, POC UA: NEGATIVE mg/dL
Leukocytes, UA: NEGATIVE
Nitrite, UA: NEGATIVE
POC PROTEIN,UA: NEGATIVE
Spec Grav, UA: 1.02 (ref 1.010–1.025)
Urobilinogen, UA: 0.2 E.U./dL
pH, UA: 5.5 (ref 5.0–8.0)

## 2020-10-29 ENCOUNTER — Telehealth: Payer: Medicaid Other | Admitting: Nurse Practitioner

## 2020-10-29 ENCOUNTER — Encounter: Payer: Self-pay | Admitting: Nurse Practitioner

## 2020-10-29 ENCOUNTER — Telehealth: Payer: Self-pay | Admitting: Nurse Practitioner

## 2020-10-29 ENCOUNTER — Other Ambulatory Visit: Payer: Self-pay

## 2020-10-29 ENCOUNTER — Ambulatory Visit: Payer: Medicaid Other | Attending: Nurse Practitioner | Admitting: Nurse Practitioner

## 2020-10-29 DIAGNOSIS — E039 Hypothyroidism, unspecified: Secondary | ICD-10-CM | POA: Diagnosis not present

## 2020-10-29 DIAGNOSIS — N761 Subacute and chronic vaginitis: Secondary | ICD-10-CM | POA: Diagnosis not present

## 2020-10-29 MED ORDER — LEVOTHYROXINE SODIUM 125 MCG PO TABS: 125.0000 ug | ORAL_TABLET | Freq: Every day | ORAL | 0 refills | Status: DC

## 2020-10-29 MED ORDER — FLUCONAZOLE 150 MG PO TABS: 150.0000 mg | ORAL_TABLET | Freq: Once | ORAL | 1 refills | Status: AC

## 2020-10-29 MED ORDER — METRONIDAZOLE 0.75 % VA GEL: 1.0000 | Freq: Two times a day (BID) | VAGINAL | 0 refills | Status: DC
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
## 2020-10-29 NOTE — Telephone Encounter (Signed)
Copied from CRM [id number]. Topic: General - Other >> Oct 29, 2020  1:20 PM Laural Benes, [place] C wrote: Reason for CRM: pt's pharmacist Corrie Dandy called in for assistance. She would like to discuss/ be advised about  pt's Rx for levothyroxine (EUTHYROX) 125 MCG tablet further.

## 2020-10-29 NOTE — Telephone Encounter (Signed)
Spoke to [employer] and answered the question about the RX.

## 2020-11-25 ENCOUNTER — Ambulatory Visit: Payer: Medicaid Other | Admitting: Family Medicine

## 2021-01-07 ENCOUNTER — Ambulatory Visit: Payer: Medicaid Other | Admitting: Nurse Practitioner

## 2021-01-17 ENCOUNTER — Ambulatory Visit: Payer: Medicaid Other | Admitting: Obstetrics and Gynecology

## 2021-02-07 ENCOUNTER — Ambulatory Visit: Payer: Medicaid Other | Admitting: Obstetrics and Gynecology

## 2021-02-22 ENCOUNTER — Other Ambulatory Visit: Payer: Self-pay | Admitting: Nurse Practitioner

## 2021-02-22 ENCOUNTER — Ambulatory Visit: Payer: Self-pay | Admitting: *Deleted

## 2021-02-22 DIAGNOSIS — E119 Type 2 diabetes mellitus without complications: Secondary | ICD-10-CM

## 2021-02-22 DIAGNOSIS — E039 Hypothyroidism, unspecified: Secondary | ICD-10-CM

## 2021-02-22 NOTE — Telephone Encounter (Signed)
°  Chief Complaint: nasal drainage Symptoms: nasal drainage, watery eyes, drowsy  Frequency: started yesterday-  Pertinent Negatives: Patient denies fever, cough Disposition: [] ED /[x] Urgent Care (no appt availability in office) / [] Appointment(In office/virtual)/ []  [place] [facility]/ [] Home Care/ [] Refused Recommended Disposition /[]  Mobile Bus/ []  Follow-up with PCP Additional Notes: Patient advised UC- she has to take child in for exam- cough, congestion Patient has multiple questions- need medications- missed appointment- has scheduled another Needs transportation to appointments- GYN and office

## 2021-02-22 NOTE — Telephone Encounter (Signed)
Medication Refill - Medication: metFORMIN (GLUCOPHAGE) 500 MG tablet levothyroxine (EUTHYROX) 125 MCG tablet  Pt stated she is completely out of medication.   Has the patient contacted their pharmacy? No. No, more refills.  (Agent: If no, request that the patient contact the pharmacy for the refill. If patient does not wish to contact the pharmacy document the reason why and proceed with request.)   Preferred Pharmacy (with phone number or street name):  [id number] - [place], [place] - [address]  [address] [place] [address]  Phone: [phone] Fax: [phone]  Hours: Not open 24 hours   Has the patient been seen for an appointment in the last year OR does the patient have an upcoming appointment? No  Agent: Please be advised that RX refills may take up to 3 business days. We ask that you follow-up with your pharmacy.

## 2021-02-22 NOTE — Telephone Encounter (Signed)
Summary: watery eyes, a runny nose, and a lot of drainage.   Pt stated she has watery eyes, a runny nose, and a lot of drainage. Pt stated she feels drowsy and sleepy, possibly due to the Claritin she took.  Pt stated this could be allergies started a few days ago.    Seeking clinical advice.    No appointments are available until next week. I offered pt mobile unit, pt does not want to leave the house today.      Reason for Disposition  [1] Taking antihistamines > 2 days AND [2] nasal allergy symptoms interfere with sleep, school, or work  Answer Assessment - Initial Assessment Questions 1. SYMPTOM: "What's the main symptom you're concerned about?" (e.g., runny nose, stuffiness, sneezing, itching)     Runny nose, cough, congestion,drowsy 2. SEVERITY: "How bad is it?" "What does it keep you from doing?" (e.g., sleeping, working)      Unable to sleep- allergy 3. EYES: "Are the eyes also red, watery, and itchy?"      yes 4. TRIGGER: "What pollen or other allergic substance do you think is causing the symptoms?"      Unsure- child is sick too 5. TREATMENT: "What medicine are you using?" "What medicine worked best in the past?"     Claritin- helped a little 6. OTHER SYMPTOMS: "Do you have any other symptoms?" (e.g., coughing, difficulty breathing, wheezing)      7. PREGNANCY: "Is there any chance you are pregnant?" "When was your last menstrual period?"     *No Answer*  Protocols used: Nasal Allergies (Hay Fever)-A-AH

## 2021-02-22 NOTE — Telephone Encounter (Signed)
Requested medication (s) are due for refill today: yes  Requested medication (s) are on the active medication list: yes  Last refill:  levothyroxine has expired on 01/27/21            Metformin #90 1 RF 07/20/20    Future visit scheduled: yes      Notes to clinic:  levothyroxine has expired and Metformin has overdue Hgb A1C   Requested Prescriptions  Pending Prescriptions Disp Refills   levothyroxine (EUTHYROX) 125 MCG tablet 90 tablet     Sig: Take 1 tablet (125 mcg total) by mouth daily before breakfast.     Endocrinology:  Hypothyroid Agents Failed - 02/22/2021 12:37 PM      Failed - TSH needs to be rechecked within 3 months after an abnormal result. Refill until TSH is due.      Passed - TSH in normal range and within 360 days    TSH  Date Value Ref Range Status  07/20/2020 4.040 0.450 - 4.500 uIU/mL Final          Passed - Valid encounter within last 12 months    Recent Outpatient Visits           3 months ago Chronic vaginitis   [place], Vernia Buff, NP   4 months ago Encounter for Papanicolaou smear for cervical cancer screening   [place], Vernia Buff, NP   7 months ago Type 2 diabetes mellitus without complication, without long-term current use of insulin ([place])   [place], Vernia Buff, NP   1 year ago Type 2 diabetes mellitus without complication, without long-term current use of insulin ([place])   [place], [place], MD   1 year ago Birth control counseling   [place], [place], [place]       Future Appointments             In 3 weeks [place], Dionne Bucy, PA-C [place]             metFORMIN (GLUCOPHAGE) 500 MG tablet 90 tablet 1    Sig: TAKE 1 TABLET BY MOUTH ONCE DAILY AFTER SUPPER     Endocrinology:  Diabetes - Biguanides Failed - 02/22/2021  12:37 PM      Failed - HBA1C is between 0 and 7.9 and within 180 days    HbA1c, POC (controlled diabetic range)  Date Value Ref Range Status  10/31/2017 6.6 0.0 - 7.0 % Final   Hgb A1c MFr Bld  Date Value Ref Range Status  07/20/2020 6.3 (H) 4.8 - 5.6 % Final    Comment:             Prediabetes: 5.7 - 6.4          Diabetes: >6.4          Glycemic control for adults with diabetes: <7.0           Passed - Cr in normal range and within 360 days    Creatinine, Ser  Date Value Ref Range Status  07/20/2020 0.86 0.57 - 1.00 mg/dL Final   Creatinine, Urine  Date Value Ref Range Status  05/10/2016 83.00 mg/dL Final          Passed - eGFR in normal range and within 360 days    GFR calc Af [place]nia Lora  Date Value Ref  Range Status  11/21/2019 101 >59 mL/min/1.73 Final    Comment:    **In accordance with recommendations from the NKF-ASN Task force,**   [facility] is in the process of updating its eGFR calculation to the   2021 CKD-EPI creatinine equation that estimates kidney function   without a race variable.    GFR calc non Af Amer  Date Value Ref Range Status  11/21/2019 88 >59 mL/min/1.73 Final   eGFR  Date Value Ref Range Status  07/20/2020 89 >59 mL/min/1.73 Final          Passed - Valid encounter within last 6 months    Recent Outpatient Visits           3 months ago Chronic vaginitis   [place], Vernia Buff, NP   4 months ago Encounter for Papanicolaou smear for cervical cancer screening   [place], [place] W, NP   7 months ago Type 2 diabetes mellitus without complication, without long-term current use of insulin [facility])   [place], [place] W, NP   1 year ago Type 2 diabetes mellitus without complication, without long-term current use of insulin ([place])   [place], [place], MD   1 year ago Birth control counseling   [place], Dionne Bucy, [place]       Future Appointments             In 3 weeks [place], Dionne Bucy, PA-C [place]

## 2021-02-23 ENCOUNTER — Other Ambulatory Visit: Payer: Self-pay | Admitting: Nurse Practitioner

## 2021-02-23 DIAGNOSIS — E039 Hypothyroidism, unspecified: Secondary | ICD-10-CM

## 2021-02-23 NOTE — Telephone Encounter (Signed)
Requested Prescriptions  Pending Prescriptions Disp Refills   levothyroxine (SYNTHROID) 125 MCG tablet [Pharmacy Med Name: Levothyroxine Sodium 125 MCG Oral Tablet] 90 tablet 0    Sig: TAKE 1 TABLET BY MOUTH ONCE DAILY BEFORE  BREAKFAST     Endocrinology:  Hypothyroid Agents Failed - 02/23/2021 10:49 AM      Failed - TSH needs to be rechecked within 3 months after an abnormal result. Refill until TSH is due.      Passed - TSH in normal range and within 360 days    TSH  Date Value Ref Range Status  07/20/2020 4.040 0.450 - 4.500 uIU/mL Final         Passed - Valid encounter within last 12 months    Recent Outpatient Visits          3 months ago Chronic vaginitis   [place], Vernia Buff, NP   4 months ago Encounter for Papanicolaou smear for cervical cancer screening   [place], [place] W, NP   7 months ago Type 2 diabetes mellitus without complication, without long-term current use of insulin [facility])   [place], [place] W, NP   1 year ago Type 2 diabetes mellitus without complication, without long-term current use of insulin ([place])   [place] Fulp, [place], MD   1 year ago Birth control counseling   [place], [place], [place]      Future Appointments            In 3 weeks [place], Dionne Bucy, PA-C [place]

## 2021-02-24 ENCOUNTER — Ambulatory Visit (HOSPITAL_COMMUNITY)
Admission: EM | Admit: 2021-02-24 | Discharge: 2021-02-24 | Disposition: A | Discharge status: Home / Self Care | Payer: Medicaid Other | Attending: Family Medicine | Admitting: Family Medicine

## 2021-02-24 ENCOUNTER — Other Ambulatory Visit: Payer: Self-pay

## 2021-02-24 ENCOUNTER — Encounter (HOSPITAL_COMMUNITY): Payer: Self-pay | Admitting: Emergency Medicine

## 2021-02-24 DIAGNOSIS — F17200 Nicotine dependence, unspecified, uncomplicated: Secondary | ICD-10-CM | POA: Diagnosis not present

## 2021-02-24 DIAGNOSIS — R051 Acute cough: Secondary | ICD-10-CM | POA: Diagnosis present

## 2021-02-24 DIAGNOSIS — Z20822 Contact with and (suspected) exposure to covid-19: Secondary | ICD-10-CM | POA: Insufficient documentation

## 2021-02-24 DIAGNOSIS — J069 Acute upper respiratory infection, unspecified: Secondary | ICD-10-CM | POA: Diagnosis not present

## 2021-02-24 LAB — SARS CORONAVIRUS 2 (TAT 6-24 HRS): SARS Coronavirus 2: NEGATIVE

## 2021-02-24 MED ORDER — METFORMIN HCL 500 MG PO TABS: ORAL_TABLET | ORAL | 0 refills | Status: DC

## 2021-02-24 NOTE — ED Triage Notes (Signed)
Cough and runny nose started monday
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
## 2021-02-24 NOTE — Discharge Instructions (Addendum)
Continue DayQuil as needed for your symptoms.  You have been swabbed for COVID, and the test will result in the next 24 hours. Our staff will call you if positive. If the test is positive, you should quarantine for 5 days.

## 2021-02-24 NOTE — ED Notes (Signed)
Covid swab in lab

## 2021-02-28 ENCOUNTER — Ambulatory Visit (INDEPENDENT_AMBULATORY_CARE_PROVIDER_SITE_OTHER): Payer: Medicaid Other | Admitting: Obstetrics and Gynecology

## 2021-02-28 ENCOUNTER — Other Ambulatory Visit: Payer: Self-pay

## 2021-02-28 ENCOUNTER — Encounter: Payer: Self-pay | Admitting: Obstetrics and Gynecology

## 2021-02-28 ENCOUNTER — Other Ambulatory Visit (HOSPITAL_COMMUNITY)
Admission: RE | Admit: 2021-02-28 | Discharge: 2021-02-28 | Disposition: A | Discharge status: Home / Self Care | Payer: Medicaid Other | Source: Ambulatory Visit | Attending: Family Medicine | Admitting: Family Medicine

## 2021-02-28 VITALS — BP 120/70 | HR 74 | Wt 193.1 lb

## 2021-02-28 DIAGNOSIS — G8929 Other chronic pain: Secondary | ICD-10-CM | POA: Insufficient documentation

## 2021-02-28 DIAGNOSIS — R102 Pelvic and perineal pain: Secondary | ICD-10-CM | POA: Diagnosis not present

## 2021-02-28 DIAGNOSIS — Z113 Encounter for screening for infections with a predominantly sexual mode of transmission: Secondary | ICD-10-CM | POA: Diagnosis not present

## 2021-02-28 DIAGNOSIS — N879 Dysplasia of cervix uteri, unspecified: Secondary | ICD-10-CM

## 2021-02-28 DIAGNOSIS — Z3046 Encounter for surveillance of implantable subdermal contraceptive: Secondary | ICD-10-CM

## 2021-02-28 LAB — POCT PREGNANCY, URINE: Preg Test, Ur: NEGATIVE

## 2021-02-28 MED ORDER — ETONOGESTREL 68 MG ~~LOC~~ IMPL
68.0000 mg | DRUG_IMPLANT | Freq: Once | SUBCUTANEOUS | Status: AC
  Administered 2021-02-28: 68 mg via SUBCUTANEOUS
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
## 2021-02-28 NOTE — Procedures (Addendum)
Nexplanon Removal and Insertion Procedure Note Prior to the procedure being performed, the patient (or guardian) was asked to state their full name, date of birth, type of procedure being performed and the exact location of the operative site. This information was then checked against the documentation in the patient's chart. Prior to the procedure being performed, a "time out" was performed by the physician that confirmed the correct patient, procedure and site.  After informed consent was obtained, the patient's left arm was examined and the Nexplanon rod was noted to be easily palpable. The area was cleaned with alcohol then local anesthesia was infiltrated with 3 ml of 1% lidocaine with epinephrine. The area was prepped with betadine. Using sterile technique, the Nexplanon device was brought to the incision site. The capsule was scrapped off with the scalpel, the Nexplanon grasped with hemostats, and easily removed; the removal site was hemostatic. The Nexplanon was inspected and noted to be intact.   The same area was cleaned with betadine and then was infiltrated with 3 ml of 1% lidocaine with epinephrine along the planned insertion track. The area was prepped with betadine. Using sterile technique the Nexplanon device was inserted per manufacturer's guidelines in the subdermal connective tissue using the standard insertion technique without difficulty. Pressure was applied and the insertion site was hemostatic. The presence of the Nexplanon was confirmed immediately after insertion by palpation by both me and the patient and by checking the tip of needle for the absence of the insert.  A pressure dressing was applied.  The patient tolerated the procedure well.  Cornelia Copa MD Attending Center for [employer] Midwife)

## 2021-03-01 LAB — HIV ANTIBODY (ROUTINE TESTING W REFLEX): HIV Screen 4th Generation wRfx: NONREACTIVE

## 2021-03-01 LAB — URINE CULTURE

## 2021-03-01 LAB — HEPATITIS C ANTIBODY: Hep C Virus Ab: 0.8 s/co ratio (ref 0.0–0.9)

## 2021-03-01 LAB — HEPATITIS B SURFACE ANTIGEN: Hepatitis B Surface Ag: NEGATIVE

## 2021-03-01 LAB — RPR: RPR Ser Ql: NONREACTIVE

## 2021-03-03 ENCOUNTER — Encounter: Payer: Self-pay | Admitting: Obstetrics and Gynecology

## 2021-03-03 ENCOUNTER — Encounter: Payer: Self-pay | Admitting: *Deleted

## 2021-03-03 HISTORY — PX: REMOVAL OF IMPLANON ROD: OBO 1006

## 2021-03-03 LAB — CERVICOVAGINAL ANCILLARY ONLY
Bacterial Vaginitis (gardnerella): NEGATIVE
Candida Glabrata: NEGATIVE
Candida Vaginitis: NEGATIVE
Chlamydia: NEGATIVE
Comment: NEGATIVE
Comment: NEGATIVE
Comment: NEGATIVE
Comment: NEGATIVE
Comment: NEGATIVE
Comment: NORMAL
Neisseria Gonorrhea: NEGATIVE
Trichomonas: NEGATIVE

## 2021-03-14 ENCOUNTER — Inpatient Hospital Stay: Admission: RE | Admit: 2021-03-14 | Payer: Medicaid Other | Source: Ambulatory Visit

## 2021-03-16 ENCOUNTER — Ambulatory Visit: Payer: Medicaid Other | Admitting: Physician Assistant

## 2021-03-24 ENCOUNTER — Ambulatory Visit: Payer: Medicaid Other | Admitting: Obstetrics and Gynecology

## 2021-03-24 ENCOUNTER — Encounter: Payer: Self-pay | Admitting: Obstetrics and Gynecology

## 2021-03-26 NOTE — Progress Notes (Signed)
Patient did not keep her GYN appointment for 03/24/2021.  Durene Romans MD Attending Center for [employer] Fish farm manager)

## 2021-04-01 ENCOUNTER — Encounter (HOSPITAL_COMMUNITY): Payer: Self-pay

## 2021-04-01 ENCOUNTER — Ambulatory Visit (INDEPENDENT_AMBULATORY_CARE_PROVIDER_SITE_OTHER): Payer: Medicaid Other

## 2021-04-01 ENCOUNTER — Other Ambulatory Visit: Payer: Self-pay

## 2021-04-01 ENCOUNTER — Ambulatory Visit (HOSPITAL_COMMUNITY)
Admission: EM | Admit: 2021-04-01 | Discharge: 2021-04-01 | Disposition: A | Discharge status: Home / Self Care | Payer: Medicaid Other | Attending: Family Medicine | Admitting: Family Medicine

## 2021-04-01 DIAGNOSIS — R0600 Dyspnea, unspecified: Secondary | ICD-10-CM

## 2021-04-01 DIAGNOSIS — R059 Cough, unspecified: Secondary | ICD-10-CM | POA: Diagnosis not present

## 2021-04-01 DIAGNOSIS — R0602 Shortness of breath: Secondary | ICD-10-CM | POA: Diagnosis not present

## 2021-04-01 DIAGNOSIS — J019 Acute sinusitis, unspecified: Secondary | ICD-10-CM

## 2021-04-01 DIAGNOSIS — R0989 Other specified symptoms and signs involving the circulatory and respiratory systems: Secondary | ICD-10-CM | POA: Diagnosis not present

## 2021-04-01 MED ORDER — AMOXICILLIN-POT CLAVULANATE 875-125 MG PO TABS: 1.0000 | ORAL_TABLET | Freq: Two times a day (BID) | ORAL | 0 refills | Status: AC

## 2021-04-01 MED ORDER — ALBUTEROL SULFATE HFA 108 (90 BASE) MCG/ACT IN AERS: 1.0000 | INHALATION_SPRAY | RESPIRATORY_TRACT | 0 refills | Status: DC | PRN

## 2021-04-01 NOTE — ED Triage Notes (Signed)
Patient presents to Urgent Care with complaints of congestion x 1 month, SOB since this morning with some chest discomfort when taking a deep breath.  ? ?Denies fever.  ?

## 2021-04-01 NOTE — Discharge Instructions (Addendum)
Chest x-ray was clear. ? ?Take amoxicillin-clavulanate 875 mg, 1 tab twice daily with food for 7 days.  This is for possible sinus infection ? ?Use the albuterol inhaler 2 puffs every 4 hours as needed for shortness of breath or wheezing ?
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
## 2021-06-10 ENCOUNTER — Ambulatory Visit: Payer: Medicaid Other | Admitting: Nurse Practitioner

## 2021-07-12 ENCOUNTER — Other Ambulatory Visit: Payer: Self-pay | Admitting: Nurse Practitioner

## 2021-07-12 DIAGNOSIS — E039 Hypothyroidism, unspecified: Secondary | ICD-10-CM

## 2021-07-12 MED ORDER — LEVOTHYROXINE SODIUM 125 MCG PO TABS: 125.0000 ug | ORAL_TABLET | Freq: Every day | ORAL | 0 refills | Status: DC

## 2021-07-12 NOTE — Telephone Encounter (Signed)
Requested Prescriptions  Pending Prescriptions Disp Refills  . levothyroxine (SYNTHROID) 125 MCG tablet 90 tablet 0    Sig: Take 1 tablet (125 mcg total) by mouth daily before breakfast.     Endocrinology:  Hypothyroid Agents Passed - 07/12/2021 11:29 AM      Passed - TSH in normal range and within 360 days    TSH  Date Value Ref Range Status  07/20/2020 4.040 0.450 - 4.500 uIU/mL Final         Passed - Valid encounter within last 12 months    Recent Outpatient Visits          8 months ago Chronic vaginitis   [facility] [place], Shea Stakes, NP   9 months ago Encounter for Papanicolaou smear for cervical cancer screening   [facility] [place], [place] W, NP   11 months ago Type 2 diabetes mellitus without complication, without long-term current use of insulin [facility])   [place] [facility] [place], [place] W, NP   1 year ago Type 2 diabetes mellitus without complication, without long-term current use of insulin (HCC)   [facility] [place], [place], MD   2 years ago Birth control counseling   [facility] [place], [place], [place]      Future Appointments            In 3 months Claiborne Rigg, NP [facility]

## 2021-07-12 NOTE — Telephone Encounter (Signed)
Medication Refill - Medication: levothyroxine (SYNTHROID) 125 MCG tablet  Has the patient contacted their pharmacy? Yes.   (Agent: If no, request that the patient contact the pharmacy for the refill. If patient does not wish to contact the pharmacy document the reason why and proceed with request.) (Agent: If yes, when and what did the pharmacy advise?)  Preferred Pharmacy (with phone number or street name): [facility] - [place], [place] - [address] Has the patient been seen for an appointment in the last year OR does the patient have an upcoming appointment? Yes.    Agent: Please be advised that RX refills may take up to 3 business days. We ask that you follow-up with your pharmacy.

## 2021-10-01 ENCOUNTER — Other Ambulatory Visit: Payer: Self-pay | Admitting: Nurse Practitioner

## 2021-10-01 DIAGNOSIS — E785 Hyperlipidemia, unspecified: Secondary | ICD-10-CM

## 2021-10-01 DIAGNOSIS — E119 Type 2 diabetes mellitus without complications: Secondary | ICD-10-CM

## 2021-10-04 NOTE — Telephone Encounter (Signed)
Requested medication (s) are due for refill today: Yes  Requested medication (s) are on the active medication list: Yes  Last refill:  07/20/20  Future visit scheduled: Yes  Notes to clinic:  Prescriptions are expired.    Requested Prescriptions  Pending Prescriptions Disp Refills   ACCU-CHEK GUIDE test strip [Pharmacy Med Name: Accu-Chek Guide In Vitro Strip] 100 each 0    Sig: USE AS DIRECTED TO  CHECK  BLOOD  SUGARS  ONCE  PER  DAY     Endocrinology: Diabetes - Testing Supplies Passed - 10/01/2021 11:06 AM      Passed - Valid encounter within last 12 months    Recent Outpatient Visits           11 months ago Chronic vaginitis   [place] [facility] [place], Shea Stakes, NP   12 months ago Encounter for Papanicolaou smear for cervical cancer screening   [facility] [place], [place] W, NP   1 year ago Type 2 diabetes mellitus without complication, without long-term current use of insulin (HCC)   [facility] [place], [place] W, NP   1 year ago Type 2 diabetes mellitus without complication, without long-term current use of insulin (HCC)   [facility] [place], [place], MD   2 years ago Birth control counseling   [facility] [place], [place], [place]       Future Appointments             In 1 week Claiborne Rigg, NP [facility]             rosuvastatin (CRESTOR) 10 MG tablet [Pharmacy Med Name: Rosuvastatin Calcium 10 MG Oral Tablet] 90 tablet 0    Sig: TAKE 1 TABLET BY MOUTH ONCE DAILY TO  LOWER  CHOLESTEROL     Cardiovascular:  Antilipid - Statins 2 Failed - 10/01/2021 11:06 AM      Failed - Cr in normal range and within 360 days    Creatinine, Ser  Date Value Ref Range Status  07/20/2020 0.86 0.57 - 1.00 mg/dL Final   Creatinine, Urine  Date Value Ref Range Status  05/10/2016 83.00 mg/dL Final          Failed - Lipid Panel in normal range within the last 12 months    Cholesterol, Total  Date Value Ref Range Status  07/20/2020 128 100 - 199 mg/dL Final   LDL Chol Calc (NIH)  Date Value Ref Range Status  07/20/2020 51 0 - 99 mg/dL Final   HDL  Date Value Ref Range Status  07/20/2020 53 >39 mg/dL Final   Triglycerides  Date Value Ref Range Status  07/20/2020 142 0 - 149 mg/dL Final         Passed - Patient is not pregnant      Passed - Valid encounter within last 12 months    Recent Outpatient Visits           11 months ago Chronic vaginitis   [place] [facility] [place], Shea Stakes, NP   12 months ago Encounter for Papanicolaou smear for cervical cancer screening   [facility] [place], [place] W, NP   1 year ago Type 2 diabetes mellitus without complication, without long-term current use of insulin [facility])   [place] [facility] [place], Shea Stakes, NP  1 year ago Type 2 diabetes mellitus without complication, without long-term current use of insulin (HCC)   [facility] [place], [place], MD   2 years ago Birth control counseling   [facility] [place], Marzella [place], [place]       Future Appointments             In 1 week Claiborne Rigg, NP [facility]

## 2021-10-14 ENCOUNTER — Ambulatory Visit: Payer: Medicaid Other | Admitting: Nurse Practitioner

## 2022-01-09 ENCOUNTER — Other Ambulatory Visit: Payer: Self-pay | Admitting: Pharmacist

## 2022-01-09 ENCOUNTER — Encounter: Payer: Self-pay | Admitting: Nurse Practitioner

## 2022-01-09 ENCOUNTER — Other Ambulatory Visit: Payer: Self-pay

## 2022-01-09 ENCOUNTER — Ambulatory Visit: Payer: Medicaid Other | Attending: Nurse Practitioner | Admitting: Nurse Practitioner

## 2022-01-09 VITALS — BP 130/82 | HR 63 | Ht 63.5 in | Wt 182.2 lb

## 2022-01-09 DIAGNOSIS — Z79899 Other long term (current) drug therapy: Secondary | ICD-10-CM | POA: Insufficient documentation

## 2022-01-09 DIAGNOSIS — F1220 Cannabis dependence, uncomplicated: Secondary | ICD-10-CM | POA: Insufficient documentation

## 2022-01-09 DIAGNOSIS — Z23 Encounter for immunization: Secondary | ICD-10-CM

## 2022-01-09 DIAGNOSIS — M545 Low back pain, unspecified: Secondary | ICD-10-CM | POA: Insufficient documentation

## 2022-01-09 DIAGNOSIS — J309 Allergic rhinitis, unspecified: Secondary | ICD-10-CM

## 2022-01-09 DIAGNOSIS — F333 Major depressive disorder, recurrent, severe with psychotic symptoms: Secondary | ICD-10-CM | POA: Insufficient documentation

## 2022-01-09 DIAGNOSIS — F17200 Nicotine dependence, unspecified, uncomplicated: Secondary | ICD-10-CM | POA: Diagnosis not present

## 2022-01-09 DIAGNOSIS — E039 Hypothyroidism, unspecified: Secondary | ICD-10-CM | POA: Diagnosis not present

## 2022-01-09 DIAGNOSIS — G8929 Other chronic pain: Secondary | ICD-10-CM

## 2022-01-09 DIAGNOSIS — Z79890 Hormone replacement therapy (postmenopausal): Secondary | ICD-10-CM | POA: Insufficient documentation

## 2022-01-09 DIAGNOSIS — R87610 Atypical squamous cells of undetermined significance on cytologic smear of cervix (ASC-US): Secondary | ICD-10-CM | POA: Diagnosis not present

## 2022-01-09 DIAGNOSIS — Z7984 Long term (current) use of oral hypoglycemic drugs: Secondary | ICD-10-CM | POA: Diagnosis not present

## 2022-01-09 DIAGNOSIS — D72829 Elevated white blood cell count, unspecified: Secondary | ICD-10-CM | POA: Diagnosis not present

## 2022-01-09 DIAGNOSIS — E7800 Pure hypercholesterolemia, unspecified: Secondary | ICD-10-CM | POA: Diagnosis not present

## 2022-01-09 DIAGNOSIS — Z1231 Encounter for screening mammogram for malignant neoplasm of breast: Secondary | ICD-10-CM

## 2022-01-09 DIAGNOSIS — K219 Gastro-esophageal reflux disease without esophagitis: Secondary | ICD-10-CM | POA: Diagnosis not present

## 2022-01-09 DIAGNOSIS — E119 Type 2 diabetes mellitus without complications: Secondary | ICD-10-CM | POA: Insufficient documentation

## 2022-01-09 LAB — POCT GLYCOSYLATED HEMOGLOBIN (HGB A1C): HbA1c, POC (controlled diabetic range): 6 % (ref 0.0–7.0)

## 2022-01-09 LAB — POCT URINALYSIS DIP (CLINITEK)
Bilirubin, UA: NEGATIVE
Blood, UA: NEGATIVE
Glucose, UA: NEGATIVE mg/dL
Ketones, POC UA: NEGATIVE mg/dL
Nitrite, UA: NEGATIVE
Spec Grav, UA: 1.02 (ref 1.010–1.025)
Urobilinogen, UA: 0.2 E.U./dL
pH, UA: 7 (ref 5.0–8.0)

## 2022-01-09 MED ORDER — LANSOPRAZOLE 30 MG PO CPDR
DELAYED_RELEASE_CAPSULE | ORAL | 3 refills | Status: DC
  Filled 2022-01-09: qty 90, 90d supply, fill #0

## 2022-01-09 MED ORDER — ACCU-CHEK SOFTCLIX LANCETS MISC
11 refills | Status: DC
  Filled 2022-01-09: qty 100, 100d supply, fill #0

## 2022-01-09 MED ORDER — LEVOTHYROXINE SODIUM 125 MCG PO TABS
125.0000 ug | ORAL_TABLET | Freq: Every day | ORAL | 0 refills | Status: DC
  Filled 2022-01-09: qty 90, 90d supply, fill #0

## 2022-01-09 MED ORDER — TRAZODONE HCL 50 MG PO TABS
25.0000 mg | ORAL_TABLET | Freq: Every evening | ORAL | 3 refills | Status: DC | PRN
  Filled 2022-01-09: qty 90, 90d supply, fill #0

## 2022-01-09 MED ORDER — ALBUTEROL SULFATE HFA 108 (90 BASE) MCG/ACT IN AERS
1.0000 | INHALATION_SPRAY | RESPIRATORY_TRACT | 0 refills | Status: DC | PRN
  Filled 2022-01-09: qty 6.7, 25d supply, fill #0

## 2022-01-09 MED ORDER — ALBUTEROL SULFATE HFA 108 (90 BASE) MCG/ACT IN AERS
1.0000 | INHALATION_SPRAY | RESPIRATORY_TRACT | 0 refills | Status: DC | PRN
  Filled 2022-01-09: qty 18, 16d supply, fill #0

## 2022-01-09 MED ORDER — FLUTICASONE PROPIONATE 50 MCG/ACT NA SUSP
2.0000 | Freq: Every day | NASAL | 6 refills | Status: DC
  Filled 2022-01-09: qty 16, 30d supply, fill #0

## 2022-01-09 MED ORDER — METFORMIN HCL 500 MG PO TABS
ORAL_TABLET | ORAL | 0 refills | Status: DC
  Filled 2022-01-09: qty 90, 90d supply, fill #0

## 2022-01-09 MED ORDER — ACCU-CHEK GUIDE VI STRP
ORAL_STRIP | 12 refills | Status: AC
  Filled 2022-01-09: qty 100, 100d supply, fill #0

## 2022-01-09 MED ORDER — METHOCARBAMOL 500 MG PO TABS
500.0000 mg | ORAL_TABLET | Freq: Three times a day (TID) | ORAL | 0 refills | Status: DC | PRN
  Filled 2022-01-09: qty 60, 20d supply, fill #0

## 2022-01-09 MED ORDER — ROSUVASTATIN CALCIUM 10 MG PO TABS
ORAL_TABLET | ORAL | 3 refills | Status: DC
  Filled 2022-01-09: qty 90, 90d supply, fill #0

## 2022-01-09 MED ORDER — CETIRIZINE HCL 10 MG PO TABS
10.0000 mg | ORAL_TABLET | Freq: Every day | ORAL | 2 refills | Status: DC
  Filled 2022-01-09: qty 90, 90d supply, fill #0

## 2022-01-09 MED ORDER — SERTRALINE HCL 50 MG PO TABS
50.0000 mg | ORAL_TABLET | Freq: Every day | ORAL | 1 refills | Status: DC
  Filled 2022-01-09: qty 90, 90d supply, fill #0
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
## 2022-01-10 LAB — CBC WITH DIFFERENTIAL/PLATELET
Basophils Absolute: 0 10*3/uL (ref 0.0–0.2)
Basos: 0 %
EOS (ABSOLUTE): 0.2 10*3/uL (ref 0.0–0.4)
Eos: 2 %
Hematocrit: 40.2 % (ref 34.0–46.6)
Hemoglobin: 12.8 g/dL (ref 11.1–15.9)
Immature Grans (Abs): 0 10*3/uL (ref 0.0–0.1)
Immature Granulocytes: 0 %
Lymphocytes Absolute: 3.1 10*3/uL (ref 0.7–3.1)
Lymphs: 27 %
MCH: 28.6 pg (ref 26.6–33.0)
MCHC: 31.8 g/dL (ref 31.5–35.7)
MCV: 90 fL (ref 79–97)
Monocytes Absolute: 0.6 10*3/uL (ref 0.1–0.9)
Monocytes: 5 %
Neutrophils Absolute: 7.6 10*3/uL — ABNORMAL HIGH (ref 1.4–7.0)
Neutrophils: 66 %
Platelets: 340 10*3/uL (ref 150–450)
RBC: 4.48 x10E6/uL (ref 3.77–5.28)
RDW: 14.3 % (ref 11.7–15.4)
WBC: 11.5 10*3/uL — ABNORMAL HIGH (ref 3.4–10.8)

## 2022-01-10 LAB — CMP14+EGFR
ALT: 17 IU/L (ref 0–32)
AST: 19 IU/L (ref 0–40)
Albumin/Globulin Ratio: 1.6 (ref 1.2–2.2)
Albumin: 4.6 g/dL (ref 3.9–4.9)
Alkaline Phosphatase: 97 IU/L (ref 44–121)
BUN/Creatinine Ratio: 11 (ref 9–23)
BUN: 9 mg/dL (ref 6–24)
Bilirubin Total: <0.2 mg/dL (ref 0.0–1.2)
CO2: 21 mmol/L (ref 20–29)
Calcium: 9.4 mg/dL (ref 8.7–10.2)
Chloride: 103 mmol/L (ref 96–106)
Creatinine, Ser: 0.84 mg/dL (ref 0.57–1.00)
Globulin, Total: 2.8 g/dL (ref 1.5–4.5)
Glucose: 102 mg/dL — ABNORMAL HIGH (ref 70–99)
Potassium: 4.4 mmol/L (ref 3.5–5.2)
Sodium: 138 mmol/L (ref 134–144)
Total Protein: 7.4 g/dL (ref 6.0–8.5)
eGFR: 90 mL/min/{1.73_m2} (ref >59)

## 2022-01-10 LAB — THYROID PANEL WITH TSH
Free Thyroxine Index: 0.9 — ABNORMAL LOW (ref 1.2–4.9)
T3 Uptake Ratio: 26 % (ref 24–39)
T4, Total: 3.4 ug/dL — ABNORMAL LOW (ref 4.5–12.0)
TSH: 38.4 u[IU]/mL — ABNORMAL HIGH (ref 0.450–4.500)

## 2022-02-06 ENCOUNTER — Other Ambulatory Visit: Payer: Medicaid Other

## 2022-05-17 ENCOUNTER — Encounter: Payer: Self-pay | Admitting: Nurse Practitioner

## 2022-05-17 ENCOUNTER — Ambulatory Visit: Payer: Medicaid Other | Attending: Nurse Practitioner | Admitting: Nurse Practitioner

## 2022-05-17 ENCOUNTER — Other Ambulatory Visit: Payer: Self-pay

## 2022-05-17 VITALS — BP 112/69 | HR 60 | Ht 63.5 in | Wt 173.2 lb

## 2022-05-17 DIAGNOSIS — J309 Allergic rhinitis, unspecified: Secondary | ICD-10-CM | POA: Diagnosis not present

## 2022-05-17 DIAGNOSIS — Z79899 Other long term (current) drug therapy: Secondary | ICD-10-CM | POA: Insufficient documentation

## 2022-05-17 DIAGNOSIS — F333 Major depressive disorder, recurrent, severe with psychotic symptoms: Secondary | ICD-10-CM

## 2022-05-17 DIAGNOSIS — I10 Essential (primary) hypertension: Secondary | ICD-10-CM | POA: Insufficient documentation

## 2022-05-17 DIAGNOSIS — G8929 Other chronic pain: Secondary | ICD-10-CM | POA: Diagnosis not present

## 2022-05-17 DIAGNOSIS — E7800 Pure hypercholesterolemia, unspecified: Secondary | ICD-10-CM | POA: Diagnosis not present

## 2022-05-17 DIAGNOSIS — D72829 Elevated white blood cell count, unspecified: Secondary | ICD-10-CM | POA: Insufficient documentation

## 2022-05-17 DIAGNOSIS — Z7984 Long term (current) use of oral hypoglycemic drugs: Secondary | ICD-10-CM | POA: Diagnosis not present

## 2022-05-17 DIAGNOSIS — E119 Type 2 diabetes mellitus without complications: Secondary | ICD-10-CM | POA: Diagnosis not present

## 2022-05-17 DIAGNOSIS — Z79890 Hormone replacement therapy (postmenopausal): Secondary | ICD-10-CM | POA: Insufficient documentation

## 2022-05-17 DIAGNOSIS — E039 Hypothyroidism, unspecified: Secondary | ICD-10-CM

## 2022-05-17 DIAGNOSIS — M545 Low back pain, unspecified: Secondary | ICD-10-CM | POA: Diagnosis not present

## 2022-05-17 DIAGNOSIS — K219 Gastro-esophageal reflux disease without esophagitis: Secondary | ICD-10-CM | POA: Diagnosis not present

## 2022-05-17 MED ORDER — ROSUVASTATIN CALCIUM 10 MG PO TABS
10.0000 mg | ORAL_TABLET | Freq: Every day | ORAL | 3 refills | Status: DC
  Filled 2022-05-17: qty 90, 90d supply, fill #0
  Filled 2022-10-05: qty 90, 90d supply, fill #1
  Filled 2023-03-06: qty 90, 90d supply, fill #2

## 2022-05-17 MED ORDER — ACCU-CHEK FASTCLIX LANCETS MISC
6 refills | Status: AC
Start: 2022-05-17 — End: ?
  Filled 2022-05-17: qty 102, 51d supply, fill #0

## 2022-05-17 MED ORDER — TRAZODONE HCL 100 MG PO TABS
100.0000 mg | ORAL_TABLET | Freq: Every day | ORAL | 1 refills | Status: AC
Start: 2022-05-17 — End: ?
  Filled 2022-05-17: qty 90, 90d supply, fill #0

## 2022-05-17 MED ORDER — METFORMIN HCL 500 MG PO TABS
500.0000 mg | ORAL_TABLET | Freq: Every day | ORAL | 1 refills | Status: DC
Start: 2022-05-17 — End: 2023-01-11
  Filled 2022-05-17 – 2022-10-05 (×2): qty 90, 90d supply, fill #0

## 2022-05-17 MED ORDER — LANSOPRAZOLE 30 MG PO CPDR
30.0000 mg | DELAYED_RELEASE_CAPSULE | Freq: Every day | ORAL | 3 refills | Status: AC
Start: 2022-05-17 — End: ?
  Filled 2022-05-17 – 2022-10-05 (×2): qty 90, 90d supply, fill #0
  Filled 2023-03-06: qty 90, 90d supply, fill #1

## 2022-05-17 MED ORDER — LEVOTHYROXINE SODIUM 125 MCG PO TABS
125.0000 ug | ORAL_TABLET | Freq: Every day | ORAL | 0 refills | Status: DC
  Filled 2022-05-17: qty 90, 90d supply, fill #0

## 2022-05-17 MED ORDER — ALBUTEROL SULFATE HFA 108 (90 BASE) MCG/ACT IN AERS
1.0000 | INHALATION_SPRAY | RESPIRATORY_TRACT | 0 refills | Status: DC | PRN
Start: 2022-05-17 — End: 2022-10-19
  Filled 2022-05-17: qty 18, 17d supply, fill #0

## 2022-05-17 MED ORDER — ACCU-CHEK SOFTCLIX LANCETS MISC
6 refills | Status: DC
  Filled 2022-05-17: qty 100, 50d supply, fill #0

## 2022-05-17 MED ORDER — CETIRIZINE HCL 10 MG PO TABS
10.0000 mg | ORAL_TABLET | Freq: Every day | ORAL | 2 refills | Status: DC
  Filled 2022-05-17: qty 90, 90d supply, fill #0
  Filled 2022-10-05: qty 30, 30d supply, fill #0
  Filled 2023-03-06: qty 30, 30d supply, fill #1

## 2022-05-17 MED ORDER — METHOCARBAMOL 500 MG PO TABS
500.0000 mg | ORAL_TABLET | Freq: Three times a day (TID) | ORAL | 3 refills | Status: DC | PRN
Start: 2022-05-17 — End: 2023-01-10
  Filled 2022-05-17: qty 60, 20d supply, fill #0

## 2022-05-17 MED ORDER — FLUTICASONE PROPIONATE 50 MCG/ACT NA SUSP
2.0000 | Freq: Every day | NASAL | 6 refills | Status: DC
Start: 2022-05-17 — End: 2023-10-31
  Filled 2022-05-17: qty 16, 30d supply, fill #0

## 2022-05-17 MED ORDER — SERTRALINE HCL 50 MG PO TABS
50.0000 mg | ORAL_TABLET | Freq: Every day | ORAL | 1 refills | Status: AC
Start: 2022-05-17 — End: ?
  Filled 2022-05-17: qty 90, 90d supply, fill #0

## 2022-05-17 NOTE — Patient Instructions (Signed)
TRANSPORTATION [phone]

## 2022-05-17 NOTE — Progress Notes (Signed)
Needs information to gynecology for pap. Patient has upcoming appointment.
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
## 2022-05-18 LAB — CMP14+EGFR
ALT: 15 IU/L (ref 0–32)
AST: 23 IU/L (ref 0–40)
Albumin/Globulin Ratio: 1.5 (ref 1.2–2.2)
Albumin: 4.4 g/dL (ref 3.9–4.9)
Alkaline Phosphatase: 105 IU/L (ref 44–121)
BUN/Creatinine Ratio: 15 (ref 9–23)
BUN: 13 mg/dL (ref 6–24)
Bilirubin Total: 0.2 mg/dL (ref 0.0–1.2)
CO2: 20 mmol/L (ref 20–29)
Calcium: 9.2 mg/dL (ref 8.7–10.2)
Chloride: 102 mmol/L (ref 96–106)
Creatinine, Ser: 0.84 mg/dL (ref 0.57–1.00)
Globulin, Total: 2.9 g/dL (ref 1.5–4.5)
Glucose: 87 mg/dL (ref 70–99)
Potassium: 4.8 mmol/L (ref 3.5–5.2)
Sodium: 138 mmol/L (ref 134–144)
Total Protein: 7.3 g/dL (ref 6.0–8.5)
eGFR: 90 mL/min/{1.73_m2} (ref >59)

## 2022-05-18 LAB — THYROID PANEL WITH TSH
Free Thyroxine Index: 1.1 — ABNORMAL LOW (ref 1.2–4.9)
T3 Uptake Ratio: 23 % — ABNORMAL LOW (ref 24–39)
T4, Total: 4.6 ug/dL (ref 4.5–12.0)
TSH: 28.8 u[IU]/mL — ABNORMAL HIGH (ref 0.450–4.500)

## 2022-05-18 LAB — CBC WITH DIFFERENTIAL/PLATELET
Basophils Absolute: 0 10*3/uL (ref 0.0–0.2)
Basos: 0 %
EOS (ABSOLUTE): 0.2 10*3/uL (ref 0.0–0.4)
Eos: 2 %
Hematocrit: 38.7 % (ref 34.0–46.6)
Hemoglobin: 12.3 g/dL (ref 11.1–15.9)
Immature Grans (Abs): 0 10*3/uL (ref 0.0–0.1)
Immature Granulocytes: 0 %
Lymphocytes Absolute: 2.6 10*3/uL (ref 0.7–3.1)
Lymphs: 30 %
MCH: 29.1 pg (ref 26.6–33.0)
MCHC: 31.8 g/dL (ref 31.5–35.7)
MCV: 92 fL (ref 79–97)
Monocytes Absolute: 0.5 10*3/uL (ref 0.1–0.9)
Monocytes: 6 %
Neutrophils Absolute: 5.4 10*3/uL (ref 1.4–7.0)
Neutrophils: 62 %
Platelets: 303 10*3/uL (ref 150–450)
RBC: 4.23 x10E6/uL (ref 3.77–5.28)
RDW: 15.3 % (ref 11.7–15.4)
WBC: 8.8 10*3/uL (ref 3.4–10.8)

## 2022-05-20 ENCOUNTER — Other Ambulatory Visit: Payer: Self-pay | Admitting: Nurse Practitioner

## 2022-05-20 DIAGNOSIS — E039 Hypothyroidism, unspecified: Secondary | ICD-10-CM

## 2022-05-23 ENCOUNTER — Other Ambulatory Visit: Payer: Self-pay

## 2022-05-25 ENCOUNTER — Encounter: Payer: Self-pay | Admitting: Obstetrics and Gynecology

## 2022-06-14 ENCOUNTER — Other Ambulatory Visit: Payer: Medicaid Other

## 2022-06-19 ENCOUNTER — Ambulatory Visit: Payer: Medicaid Other | Attending: Nurse Practitioner

## 2022-06-19 DIAGNOSIS — E039 Hypothyroidism, unspecified: Secondary | ICD-10-CM

## 2022-06-20 LAB — THYROID PANEL WITH TSH
Free Thyroxine Index: 2.7 (ref 1.2–4.9)
T3 Uptake Ratio: 33 % (ref 24–39)
T4, Total: 8.1 ug/dL (ref 4.5–12.0)
TSH: 0.776 u[IU]/mL (ref 0.450–4.500)

## 2022-07-11 ENCOUNTER — Ambulatory Visit: Payer: Self-pay | Admitting: Nurse Practitioner

## 2022-08-16 ENCOUNTER — Ambulatory Visit: Payer: Self-pay | Admitting: Nurse Practitioner

## 2022-10-04 ENCOUNTER — Other Ambulatory Visit: Payer: Self-pay | Admitting: Nurse Practitioner

## 2022-10-04 DIAGNOSIS — E039 Hypothyroidism, unspecified: Secondary | ICD-10-CM

## 2022-10-04 NOTE — Telephone Encounter (Signed)
Medication Refill - Medication: levothyroxine (SYNTHROID) 125 MCG tablet   Has the patient contacted their pharmacy? Yes.   Pt needs appt and she did make appt for 9/19  Preferred Pharmacy (with phone number or street name): [facility]  Has the patient been seen for an appointment in the last year OR does the patient have an upcoming appointment? Yes.    Pt would like a 30 day supply please to get her through to appt.  Pt is out of medication.

## 2022-10-05 ENCOUNTER — Other Ambulatory Visit: Payer: Self-pay | Admitting: Nurse Practitioner

## 2022-10-05 ENCOUNTER — Other Ambulatory Visit: Payer: Self-pay

## 2022-10-05 ENCOUNTER — Other Ambulatory Visit: Payer: Self-pay | Admitting: Pharmacist

## 2022-10-05 DIAGNOSIS — E039 Hypothyroidism, unspecified: Secondary | ICD-10-CM

## 2022-10-05 MED ORDER — LEVOTHYROXINE SODIUM 125 MCG PO TABS
125.0000 ug | ORAL_TABLET | Freq: Every day | ORAL | 0 refills | Status: DC
Start: 2022-10-05 — End: 2023-01-11
  Filled 2022-10-05: qty 90, 90d supply, fill #0

## 2022-10-19 ENCOUNTER — Other Ambulatory Visit: Payer: Self-pay

## 2022-10-19 ENCOUNTER — Other Ambulatory Visit (HOSPITAL_COMMUNITY)
Admission: RE | Admit: 2022-10-19 | Discharge: 2022-10-19 | Disposition: A | Discharge status: Home / Self Care | Payer: MEDICAID | Source: Ambulatory Visit | Attending: Nurse Practitioner | Admitting: Nurse Practitioner

## 2022-10-19 ENCOUNTER — Encounter: Payer: Self-pay | Admitting: Physician Assistant

## 2022-10-19 ENCOUNTER — Ambulatory Visit: Payer: MEDICAID | Attending: Nurse Practitioner | Admitting: Physician Assistant

## 2022-10-19 VITALS — BP 123/76 | HR 62 | Temp 98.8°F | Ht 63.0 in | Wt 166.2 lb

## 2022-10-19 DIAGNOSIS — Z8619 Personal history of other infectious and parasitic diseases: Secondary | ICD-10-CM | POA: Insufficient documentation

## 2022-10-19 DIAGNOSIS — Z23 Encounter for immunization: Secondary | ICD-10-CM | POA: Diagnosis not present

## 2022-10-19 DIAGNOSIS — E1165 Type 2 diabetes mellitus with hyperglycemia: Secondary | ICD-10-CM | POA: Diagnosis not present

## 2022-10-19 DIAGNOSIS — Z113 Encounter for screening for infections with a predominantly sexual mode of transmission: Secondary | ICD-10-CM

## 2022-10-19 DIAGNOSIS — Z7984 Long term (current) use of oral hypoglycemic drugs: Secondary | ICD-10-CM

## 2022-10-19 DIAGNOSIS — R0989 Other specified symptoms and signs involving the circulatory and respiratory systems: Secondary | ICD-10-CM

## 2022-10-19 DIAGNOSIS — J309 Allergic rhinitis, unspecified: Secondary | ICD-10-CM

## 2022-10-19 LAB — POCT GLYCOSYLATED HEMOGLOBIN (HGB A1C): HbA1c, POC (controlled diabetic range): 5.8 % (ref 0.0–7.0)

## 2022-10-19 LAB — GLUCOSE, POCT (MANUAL RESULT ENTRY): POC Glucose: 109 mg/dl — AB (ref 70–99)

## 2022-10-19 MED ORDER — BENZONATATE 100 MG PO CAPS
200.0000 mg | ORAL_CAPSULE | Freq: Three times a day (TID) | ORAL | 0 refills | Status: DC | PRN
Start: 2022-10-19 — End: 2023-10-31
  Filled 2022-10-19 – 2022-11-30 (×2): qty 40, 7d supply, fill #0

## 2022-10-19 MED ORDER — ALBUTEROL SULFATE HFA 108 (90 BASE) MCG/ACT IN AERS
1.0000 | INHALATION_SPRAY | RESPIRATORY_TRACT | 0 refills | Status: AC | PRN
  Filled 2022-10-19: qty 18, 17d supply, fill #0
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
## 2022-10-19 NOTE — Patient Instructions (Signed)
Safe Sex Practicing safe sex means taking steps before and during sex to reduce your risk of: Getting an STI (sexually transmitted infection). Giving your partner an STI. Unwanted or unplanned pregnancy. How to practice safe sex Ways you can practice safe sex  Limit your sexual partners to only one partner who is having sex with only you. Avoid using alcohol and drugs before having sex. Alcohol and drugs can affect your judgment. Before having sex with a new partner: Talk to your partner about past partners, past STIs, and drug use. Get screened for STIs and discuss the results with your partner. Ask your partner to get screened too. Check your body regularly for sores, blisters, rashes, or unusual discharge. If you notice any of these problems, visit your health care provider. Avoid sexual contact if you have symptoms of an infection or you are being treated for an STI. While having sex, use a condom. Make sure to: Use a condom every time you have vaginal, oral, or anal sex. Both females and males should wear condoms during oral sex. Keep condoms in place from the beginning to the end of sexual activity. Use a latex condom, if possible. Latex condoms offer the best protection. Use only water-based lubricants with a condom. Using petroleum-based lubricants or oils will weaken the condom and increase the chance that it will break. Ways your health care provider can help you practice safe sex  See your health care provider for regular screenings, exams, and tests for STIs. Talk with your health care provider about what kind of birth control (contraception) is best for you. Get vaccinated against hepatitis B and human papillomavirus (HPV). If you are at risk of being infected with HIV (human immunodeficiency virus), talk with your health care provider about taking a prescription medicine to prevent HIV infection. You are at risk for HIV if you: Are a man who has sex with other men. Are  sexually active with more than one partner. Take drugs by injection. Have a sex partner who has HIV. Have unprotected sex. Have sex with someone who has sex with both men and women. Have had an STI. Follow these instructions at home: Take over-the-counter and prescription medicines only as told by your health care provider. Keep all follow-up visits. This is important. Where to find more information Centers for Disease Control and Prevention: FootballExhibition.com.br Planned Parenthood: www.plannedparenthood.org Office on [employer] Health: http://hoffman.com/ Summary Practicing safe sex means taking steps before and during sex to reduce your risk getting an STI, giving your partner an STI, and having an unwanted or unplanned pregnancy. Before having sex with a new partner, talk to your partner about past partners, past STIs, and drug use. Use a condom every time you have vaginal, oral, or anal sex. Both females and males should wear condoms during oral sex. Check your body regularly for sores, blisters, rashes, or unusual discharge. If you notice any of these problems, visit your health care provider. See your health care provider for regular screenings, exams, and tests for STIs. This information is not intended to replace advice given to you by your health care provider. Make sure you discuss any questions you have with your health care provider. Document Revised: 06/23/2019 Document Reviewed: 06/23/2019 Elsevier Patient Education  2024 [employer].

## 2022-10-19 NOTE — Progress Notes (Signed)
Patient wants thyroid checked.   Patient wants to speak about a physical.

## 2022-10-20 LAB — CERVICOVAGINAL ANCILLARY ONLY
Bacterial Vaginitis (gardnerella): POSITIVE — AB
Candida Glabrata: NEGATIVE
Candida Vaginitis: NEGATIVE
Chlamydia: NEGATIVE
Comment: NEGATIVE
Comment: NEGATIVE
Comment: NEGATIVE
Comment: NEGATIVE
Comment: NEGATIVE
Comment: NORMAL
Neisseria Gonorrhea: NEGATIVE
Trichomonas: NEGATIVE

## 2022-10-20 LAB — HIV ANTIBODY (ROUTINE TESTING W REFLEX): HIV Screen 4th Generation wRfx: NONREACTIVE

## 2022-10-20 LAB — TREPONEMAL ANTIBODIES, TPPA: Treponemal Antibodies, TPPA: NONREACTIVE

## 2022-10-20 LAB — RPR W/REFLEX TO TREPSURE: RPR: NONREACTIVE

## 2022-10-23 ENCOUNTER — Other Ambulatory Visit: Payer: Self-pay

## 2022-10-23 ENCOUNTER — Other Ambulatory Visit: Payer: Self-pay | Admitting: Physician Assistant

## 2022-10-23 MED ORDER — METRONIDAZOLE 500 MG PO TABS
500.0000 mg | ORAL_TABLET | Freq: Two times a day (BID) | ORAL | 0 refills | Status: DC
  Filled 2022-10-23: qty 14, 7d supply, fill #0

## 2022-10-25 ENCOUNTER — Telehealth: Payer: Self-pay

## 2022-10-25 NOTE — Telephone Encounter (Signed)
-----   Message from Georgian Co sent at 10/23/2022 10:37 AM EDT ----- Please call patient.  She was positive for bacterial vaginosis which is NOT an STD.  It is an imbalance in the bacteria of the vagina.  I sent her a prescription for this. She is NEGATIVE for chlamydia, gonorrhea, trichomonas, HIV, and syphilis.  Thanks, Georgian Co, PA-C

## 2022-10-25 NOTE — Telephone Encounter (Signed)
Pt was called and is aware of results, DOB was confirmed.  ?

## 2022-11-01 ENCOUNTER — Other Ambulatory Visit: Payer: Self-pay

## 2022-11-02 ENCOUNTER — Other Ambulatory Visit: Payer: Self-pay

## 2022-11-07 ENCOUNTER — Ambulatory Visit: Payer: MEDICAID | Admitting: Nurse Practitioner

## 2022-12-01 ENCOUNTER — Other Ambulatory Visit: Payer: Self-pay

## 2022-12-13 ENCOUNTER — Other Ambulatory Visit: Payer: Self-pay

## 2022-12-19 ENCOUNTER — Ambulatory Visit: Payer: MEDICAID | Admitting: Nurse Practitioner

## 2023-01-10 ENCOUNTER — Ambulatory Visit: Payer: MEDICAID | Admitting: Nurse Practitioner

## 2023-01-10 ENCOUNTER — Ambulatory Visit: Payer: MEDICAID | Attending: Physician Assistant | Admitting: Physician Assistant

## 2023-01-10 ENCOUNTER — Other Ambulatory Visit: Payer: Self-pay

## 2023-01-10 VITALS — BP 122/74 | HR 78 | Wt 158.2 lb

## 2023-01-10 DIAGNOSIS — E7800 Pure hypercholesterolemia, unspecified: Secondary | ICD-10-CM

## 2023-01-10 DIAGNOSIS — M545 Low back pain, unspecified: Secondary | ICD-10-CM

## 2023-01-10 DIAGNOSIS — F1220 Cannabis dependence, uncomplicated: Secondary | ICD-10-CM | POA: Diagnosis not present

## 2023-01-10 DIAGNOSIS — F1090 Alcohol use, unspecified, uncomplicated: Secondary | ICD-10-CM

## 2023-01-10 DIAGNOSIS — R0781 Pleurodynia: Secondary | ICD-10-CM

## 2023-01-10 DIAGNOSIS — G8929 Other chronic pain: Secondary | ICD-10-CM

## 2023-01-10 DIAGNOSIS — E039 Hypothyroidism, unspecified: Secondary | ICD-10-CM

## 2023-01-10 MED ORDER — LORAZEPAM 0.5 MG PO TABS: ORAL_TABLET | ORAL | 0 refills | Status: DC

## 2023-01-10 MED ORDER — METHOCARBAMOL 500 MG PO TABS
1000.0000 mg | ORAL_TABLET | Freq: Three times a day (TID) | ORAL | 3 refills | Status: DC | PRN
Start: 2023-01-10 — End: 2023-10-31
  Filled 2023-01-10 – 2023-03-06 (×2): qty 90, 15d supply, fill #0

## 2023-01-10 MED ORDER — NALTREXONE HCL 50 MG PO TABS
50.0000 mg | ORAL_TABLET | Freq: Every day | ORAL | 3 refills | Status: DC
  Filled 2023-01-10 – 2023-03-06 (×2): qty 30, 30d supply, fill #0

## 2023-01-10 NOTE — Patient Instructions (Signed)
[place].org is the website for narcotics anonymous [place].org (website) or 336-854-4278 is the information for alcoholics anonymous Both are free and immediately available for help with alcohol and drug use  
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
## 2023-01-11 ENCOUNTER — Other Ambulatory Visit: Payer: Self-pay | Admitting: Physician Assistant

## 2023-01-11 ENCOUNTER — Other Ambulatory Visit: Payer: Self-pay

## 2023-01-11 DIAGNOSIS — E119 Type 2 diabetes mellitus without complications: Secondary | ICD-10-CM

## 2023-01-11 DIAGNOSIS — E039 Hypothyroidism, unspecified: Secondary | ICD-10-CM

## 2023-01-11 LAB — COMPREHENSIVE METABOLIC PANEL
ALT: 17 [IU]/L (ref 0–32)
AST: 24 [IU]/L (ref 0–40)
Albumin: 4.7 g/dL (ref 3.9–4.9)
Alkaline Phosphatase: 111 [IU]/L (ref 44–121)
BUN/Creatinine Ratio: 15 (ref 9–23)
BUN: 13 mg/dL (ref 6–24)
Bilirubin Total: 0.3 mg/dL (ref 0.0–1.2)
CO2: 21 mmol/L (ref 20–29)
Calcium: 9.9 mg/dL (ref 8.7–10.2)
Chloride: 101 mmol/L (ref 96–106)
Creatinine, Ser: 0.86 mg/dL (ref 0.57–1.00)
Globulin, Total: 3.1 g/dL (ref 1.5–4.5)
Glucose: 130 mg/dL — ABNORMAL HIGH (ref 70–99)
Potassium: 5.1 mmol/L (ref 3.5–5.2)
Sodium: 136 mmol/L (ref 134–144)
Total Protein: 7.8 g/dL (ref 6.0–8.5)
eGFR: 87 mL/min/{1.73_m2} (ref >59)

## 2023-01-11 LAB — THYROID PANEL WITH TSH
Free Thyroxine Index: 1.9 (ref 1.2–4.9)
T3 Uptake Ratio: 26 % (ref 24–39)
T4, Total: 7.2 ug/dL (ref 4.5–12.0)
TSH: 4.3 u[IU]/mL (ref 0.450–4.500)

## 2023-01-11 MED ORDER — LEVOTHYROXINE SODIUM 125 MCG PO TABS
125.0000 ug | ORAL_TABLET | Freq: Every day | ORAL | 1 refills | Status: DC
  Filled 2023-01-11 – 2023-03-06 (×2): qty 90, 90d supply, fill #0
  Filled 2023-08-31: qty 90, 90d supply, fill #1

## 2023-01-11 MED ORDER — METFORMIN HCL 500 MG PO TABS
500.0000 mg | ORAL_TABLET | Freq: Every day | ORAL | 1 refills | Status: DC
  Filled 2023-01-11 – 2023-03-06 (×2): qty 90, 90d supply, fill #0
  Filled 2023-08-31 (×2): qty 90, 90d supply, fill #1

## 2023-01-12 ENCOUNTER — Telehealth: Payer: Self-pay

## 2023-01-12 NOTE — Telephone Encounter (Signed)
Pt was called and is aware of results, DOB was confirmed.  ?

## 2023-01-12 NOTE — Telephone Encounter (Signed)
-----   Message from Georgian Co sent at 01/11/2023  9:12 AM EST ----- Please call patient-Your blood sugar was a little elevated.  Continue metformin and avoid alcohol and other sugars in your diet.  Levothyroxine is at the correct dose(thyroid levels are normal).  I sent you new prescriptions for both.  Liver, kidneys, and electrolyte levels are normal.  Increase your water intake and follow up as planned.  Thanks, Georgian Co, PA-C

## 2023-01-15 ENCOUNTER — Telehealth: Payer: Self-pay | Admitting: Licensed Clinical Social Worker

## 2023-01-15 NOTE — Telephone Encounter (Signed)
Contact patient several times patient did not answer left a brief voicemail asking the patient to give me a call back here at [facility] also shared some resources with patient via my chart and via secure text.

## 2023-01-22 ENCOUNTER — Other Ambulatory Visit: Payer: Self-pay

## 2023-02-14 ENCOUNTER — Ambulatory Visit: Payer: MEDICAID | Admitting: Physician Assistant

## 2023-03-06 ENCOUNTER — Other Ambulatory Visit: Payer: Self-pay

## 2023-03-22 ENCOUNTER — Ambulatory Visit: Payer: MEDICAID | Admitting: Physician Assistant

## 2023-04-04 ENCOUNTER — Ambulatory Visit: Payer: MEDICAID | Admitting: Physician Assistant

## 2023-04-12 ENCOUNTER — Ambulatory Visit: Payer: MEDICAID | Admitting: Physician Assistant

## 2023-06-22 ENCOUNTER — Ambulatory Visit: Payer: MEDICAID | Admitting: Nurse Practitioner

## 2023-07-04 ENCOUNTER — Ambulatory Visit: Payer: MEDICAID | Admitting: Nurse Practitioner

## 2023-07-26 ENCOUNTER — Emergency Department (HOSPITAL_COMMUNITY)
Admission: EM | Admit: 2023-07-26 | Discharge: 2023-07-26 | Discharge status: Against Medical Advice | Payer: MEDICAID | Attending: Emergency Medicine | Admitting: Emergency Medicine

## 2023-07-26 ENCOUNTER — Encounter (HOSPITAL_COMMUNITY): Payer: Self-pay | Admitting: Emergency Medicine

## 2023-07-26 ENCOUNTER — Other Ambulatory Visit: Payer: Self-pay

## 2023-07-26 DIAGNOSIS — Z5321 Procedure and treatment not carried out due to patient leaving prior to being seen by health care provider: Secondary | ICD-10-CM | POA: Diagnosis not present

## 2023-07-26 DIAGNOSIS — R109 Unspecified abdominal pain: Secondary | ICD-10-CM | POA: Insufficient documentation

## 2023-07-26 DIAGNOSIS — R1110 Vomiting, unspecified: Secondary | ICD-10-CM | POA: Diagnosis not present

## 2023-07-26 LAB — URINALYSIS, ROUTINE W REFLEX MICROSCOPIC
Bacteria, UA: NONE SEEN
Bilirubin Urine: NEGATIVE
Glucose, UA: NEGATIVE mg/dL
Ketones, ur: 5 mg/dL — AB
Leukocytes,Ua: NEGATIVE
Nitrite: NEGATIVE
Protein, ur: NEGATIVE mg/dL
Specific Gravity, Urine: 1.006 (ref 1.005–1.030)
pH: 5 (ref 5.0–8.0)

## 2023-07-26 LAB — COMPREHENSIVE METABOLIC PANEL WITH GFR
ALT: 23 U/L (ref 0–44)
AST: 24 U/L (ref 15–41)
Albumin: 4.5 g/dL (ref 3.5–5.0)
Alkaline Phosphatase: 78 U/L (ref 38–126)
Anion gap: 14 (ref 5–15)
BUN: 10 mg/dL (ref 6–20)
CO2: 20 mmol/L — ABNORMAL LOW (ref 22–32)
Calcium: 9.5 mg/dL (ref 8.9–10.3)
Chloride: 104 mmol/L (ref 98–111)
Creatinine, Ser: 0.74 mg/dL (ref 0.44–1.00)
GFR, Estimated: >60 mL/min (ref >60)
Glucose, Bld: 97 mg/dL (ref 70–99)
Potassium: 3.7 mmol/L (ref 3.5–5.1)
Sodium: 138 mmol/L (ref 135–145)
Total Bilirubin: 0.7 mg/dL (ref 0.0–1.2)
Total Protein: 8.4 g/dL — ABNORMAL HIGH (ref 6.5–8.1)

## 2023-07-26 LAB — CBC
HCT: 41.3 % (ref 36.0–46.0)
Hemoglobin: 13.3 g/dL (ref 12.0–15.0)
MCH: 31.7 pg (ref 26.0–34.0)
MCHC: 32.2 g/dL (ref 30.0–36.0)
MCV: 98.3 fL (ref 80.0–100.0)
Platelets: 300 10*3/uL (ref 150–400)
RBC: 4.2 MIL/uL (ref 3.87–5.11)
RDW: 15.9 % — ABNORMAL HIGH (ref 11.5–15.5)
WBC: 10.5 10*3/uL (ref 4.0–10.5)
nRBC: 0 % (ref 0.0–0.2)

## 2023-07-26 LAB — LIPASE, BLOOD: Lipase: 37 U/L (ref 11–51)

## 2023-07-26 LAB — HCG, SERUM, QUALITATIVE: Preg, Serum: NEGATIVE

## 2023-07-26 NOTE — ED Triage Notes (Signed)
 PT from home via [facility] with reports of vomiting and abdominal pain. Pt does reports blood tinged sputum. Denies fevers.

## 2023-08-31 ENCOUNTER — Other Ambulatory Visit: Payer: Self-pay | Admitting: Nurse Practitioner

## 2023-08-31 ENCOUNTER — Other Ambulatory Visit: Payer: Self-pay

## 2023-08-31 DIAGNOSIS — J309 Allergic rhinitis, unspecified: Secondary | ICD-10-CM

## 2023-09-03 ENCOUNTER — Other Ambulatory Visit: Payer: Self-pay

## 2023-09-10 ENCOUNTER — Other Ambulatory Visit: Payer: Self-pay

## 2023-10-31 ENCOUNTER — Ambulatory Visit: Payer: MEDICAID | Attending: Nurse Practitioner | Admitting: Nurse Practitioner

## 2023-10-31 ENCOUNTER — Other Ambulatory Visit: Payer: Self-pay

## 2023-10-31 ENCOUNTER — Encounter: Payer: Self-pay | Admitting: Nurse Practitioner

## 2023-10-31 VITALS — BP 101/66 | HR 71 | Resp 18 | Ht 63.0 in | Wt 160.2 lb

## 2023-10-31 DIAGNOSIS — Z23 Encounter for immunization: Secondary | ICD-10-CM

## 2023-10-31 DIAGNOSIS — Z1231 Encounter for screening mammogram for malignant neoplasm of breast: Secondary | ICD-10-CM

## 2023-10-31 DIAGNOSIS — G8929 Other chronic pain: Secondary | ICD-10-CM

## 2023-10-31 DIAGNOSIS — M545 Low back pain, unspecified: Secondary | ICD-10-CM

## 2023-10-31 DIAGNOSIS — E7800 Pure hypercholesterolemia, unspecified: Secondary | ICD-10-CM

## 2023-10-31 DIAGNOSIS — K089 Disorder of teeth and supporting structures, unspecified: Secondary | ICD-10-CM

## 2023-10-31 DIAGNOSIS — E039 Hypothyroidism, unspecified: Secondary | ICD-10-CM

## 2023-10-31 DIAGNOSIS — Z7984 Long term (current) use of oral hypoglycemic drugs: Secondary | ICD-10-CM

## 2023-10-31 DIAGNOSIS — E119 Type 2 diabetes mellitus without complications: Secondary | ICD-10-CM | POA: Diagnosis not present

## 2023-10-31 DIAGNOSIS — R87810 Cervical high risk human papillomavirus (HPV) DNA test positive: Secondary | ICD-10-CM | POA: Diagnosis not present

## 2023-10-31 DIAGNOSIS — Z79890 Hormone replacement therapy (postmenopausal): Secondary | ICD-10-CM

## 2023-10-31 DIAGNOSIS — Z79899 Other long term (current) drug therapy: Secondary | ICD-10-CM

## 2023-10-31 MED ORDER — LEVOTHYROXINE SODIUM 125 MCG PO TABS
125.0000 ug | ORAL_TABLET | Freq: Every day | ORAL | 1 refills | Status: AC
  Filled 2023-10-31 – 2024-01-16 (×2): qty 90, 90d supply, fill #0

## 2023-10-31 MED ORDER — METFORMIN HCL 500 MG PO TABS
500.0000 mg | ORAL_TABLET | Freq: Every day | ORAL | 1 refills | Status: AC
  Filled 2023-10-31: qty 90, 90d supply, fill #0

## 2023-10-31 MED ORDER — METHOCARBAMOL 500 MG PO TABS
1000.0000 mg | ORAL_TABLET | Freq: Three times a day (TID) | ORAL | 3 refills | Status: AC | PRN
  Filled 2023-10-31: qty 90, 15d supply, fill #0
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
## 2023-10-31 NOTE — Patient Instructions (Addendum)
 Placed in [facility] [address] [place], [place] [address] Phone [phone]   [facility] Located in: [facility] Address: [address] [place] [address], [place], [place] [address] Phone: [phone]

## 2023-11-01 ENCOUNTER — Ambulatory Visit: Payer: Self-pay | Admitting: Nurse Practitioner

## 2023-11-01 LAB — CMP14+EGFR
ALT: 18 IU/L (ref 0–32)
AST: 22 IU/L (ref 0–40)
Albumin: 4.9 g/dL (ref 3.9–4.9)
Alkaline Phosphatase: 119 IU/L — ABNORMAL HIGH (ref 41–116)
BUN/Creatinine Ratio: 17 (ref 9–23)
BUN: 16 mg/dL (ref 6–24)
Bilirubin Total: 0.3 mg/dL (ref 0.0–1.2)
CO2: 22 mmol/L (ref 20–29)
Calcium: 10 mg/dL (ref 8.7–10.2)
Chloride: 99 mmol/L (ref 96–106)
Creatinine, Ser: 0.93 mg/dL (ref 0.57–1.00)
Globulin, Total: 2.9 g/dL (ref 1.5–4.5)
Glucose: 104 mg/dL — ABNORMAL HIGH (ref 70–99)
Potassium: 4.6 mmol/L (ref 3.5–5.2)
Sodium: 137 mmol/L (ref 134–144)
Total Protein: 7.8 g/dL (ref 6.0–8.5)
eGFR: 79 mL/min/1.73 (ref >59)

## 2023-11-01 LAB — LIPID PANEL
Chol/HDL Ratio: 1.9 ratio (ref 0.0–4.4)
Cholesterol, Total: 132 mg/dL (ref 100–199)
HDL: 69 mg/dL (ref >39)
LDL Chol Calc (NIH): 49 mg/dL (ref 0–99)
Triglycerides: 67 mg/dL (ref 0–149)
VLDL Cholesterol Cal: 14 mg/dL (ref 5–40)

## 2023-11-01 LAB — THYROID PANEL WITH TSH
Free Thyroxine Index: 2.2 (ref 1.2–4.9)
T3 Uptake Ratio: 29 % (ref 24–39)
T4, Total: 7.7 ug/dL (ref 4.5–12.0)
TSH: 3.97 u[IU]/mL (ref 0.450–4.500)

## 2023-11-01 LAB — HEMOGLOBIN A1C
Est. average glucose Bld gHb Est-mCnc: 114 mg/dL
Hgb A1c MFr Bld: 5.6 % (ref 4.8–5.6)

## 2023-11-01 LAB — MICROALBUMIN / CREATININE URINE RATIO
Creatinine, Urine: 164.7 mg/dL
Microalb/Creat Ratio: 4 mg/g{creat} (ref 0–29)
Microalbumin, Urine: 6.6 ug/mL

## 2023-11-08 ENCOUNTER — Other Ambulatory Visit: Payer: Self-pay

## 2023-11-09 ENCOUNTER — Other Ambulatory Visit: Payer: Self-pay

## 2024-01-16 ENCOUNTER — Other Ambulatory Visit: Payer: Self-pay

## 2024-01-16 ENCOUNTER — Other Ambulatory Visit: Payer: Self-pay | Admitting: Nurse Practitioner

## 2024-01-16 DIAGNOSIS — E7800 Pure hypercholesterolemia, unspecified: Secondary | ICD-10-CM

## 2024-01-16 DIAGNOSIS — J309 Allergic rhinitis, unspecified: Secondary | ICD-10-CM

## 2024-01-16 IMAGING — DX DG CHEST 2V
2 series · 2 of 2 positions shown · non-contrast
Comparison: None.

CLINICAL DATA: Dyspnea for 1 day, congestion and cough for a month

EXAM:
CHEST - 2 VIEW

[chest pa]
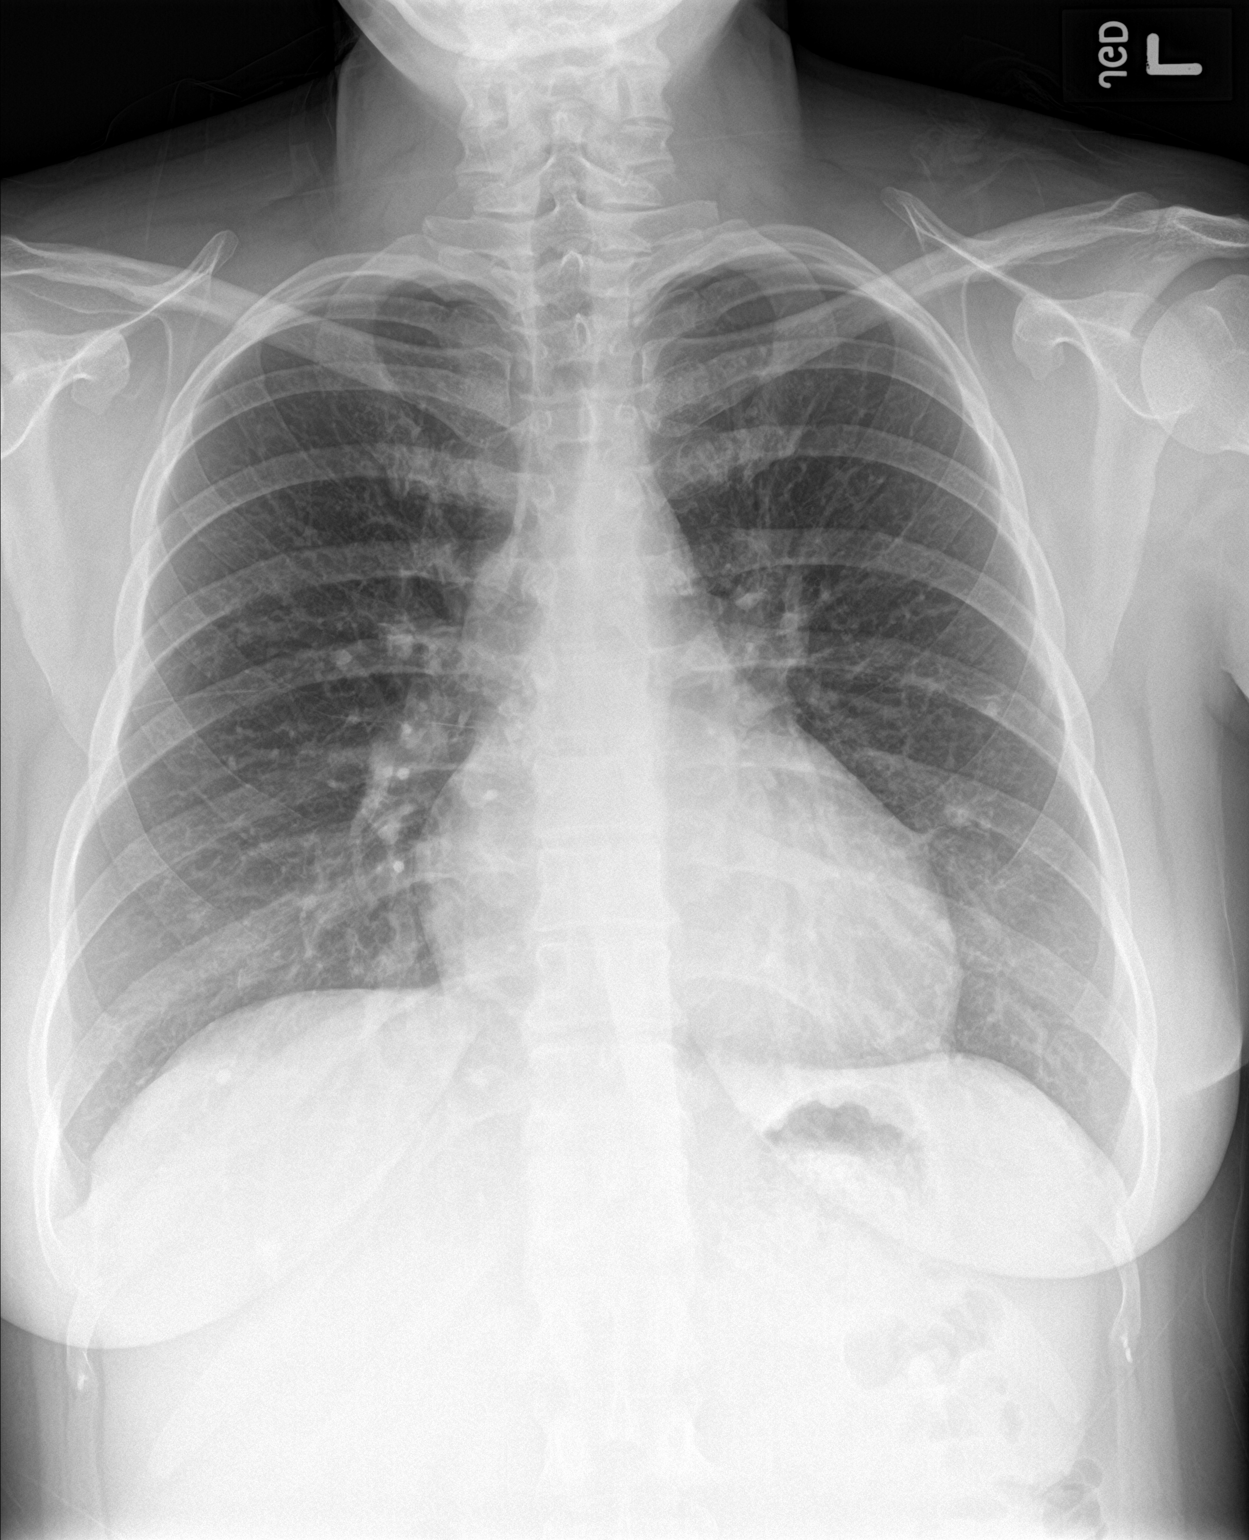

[chest lat]
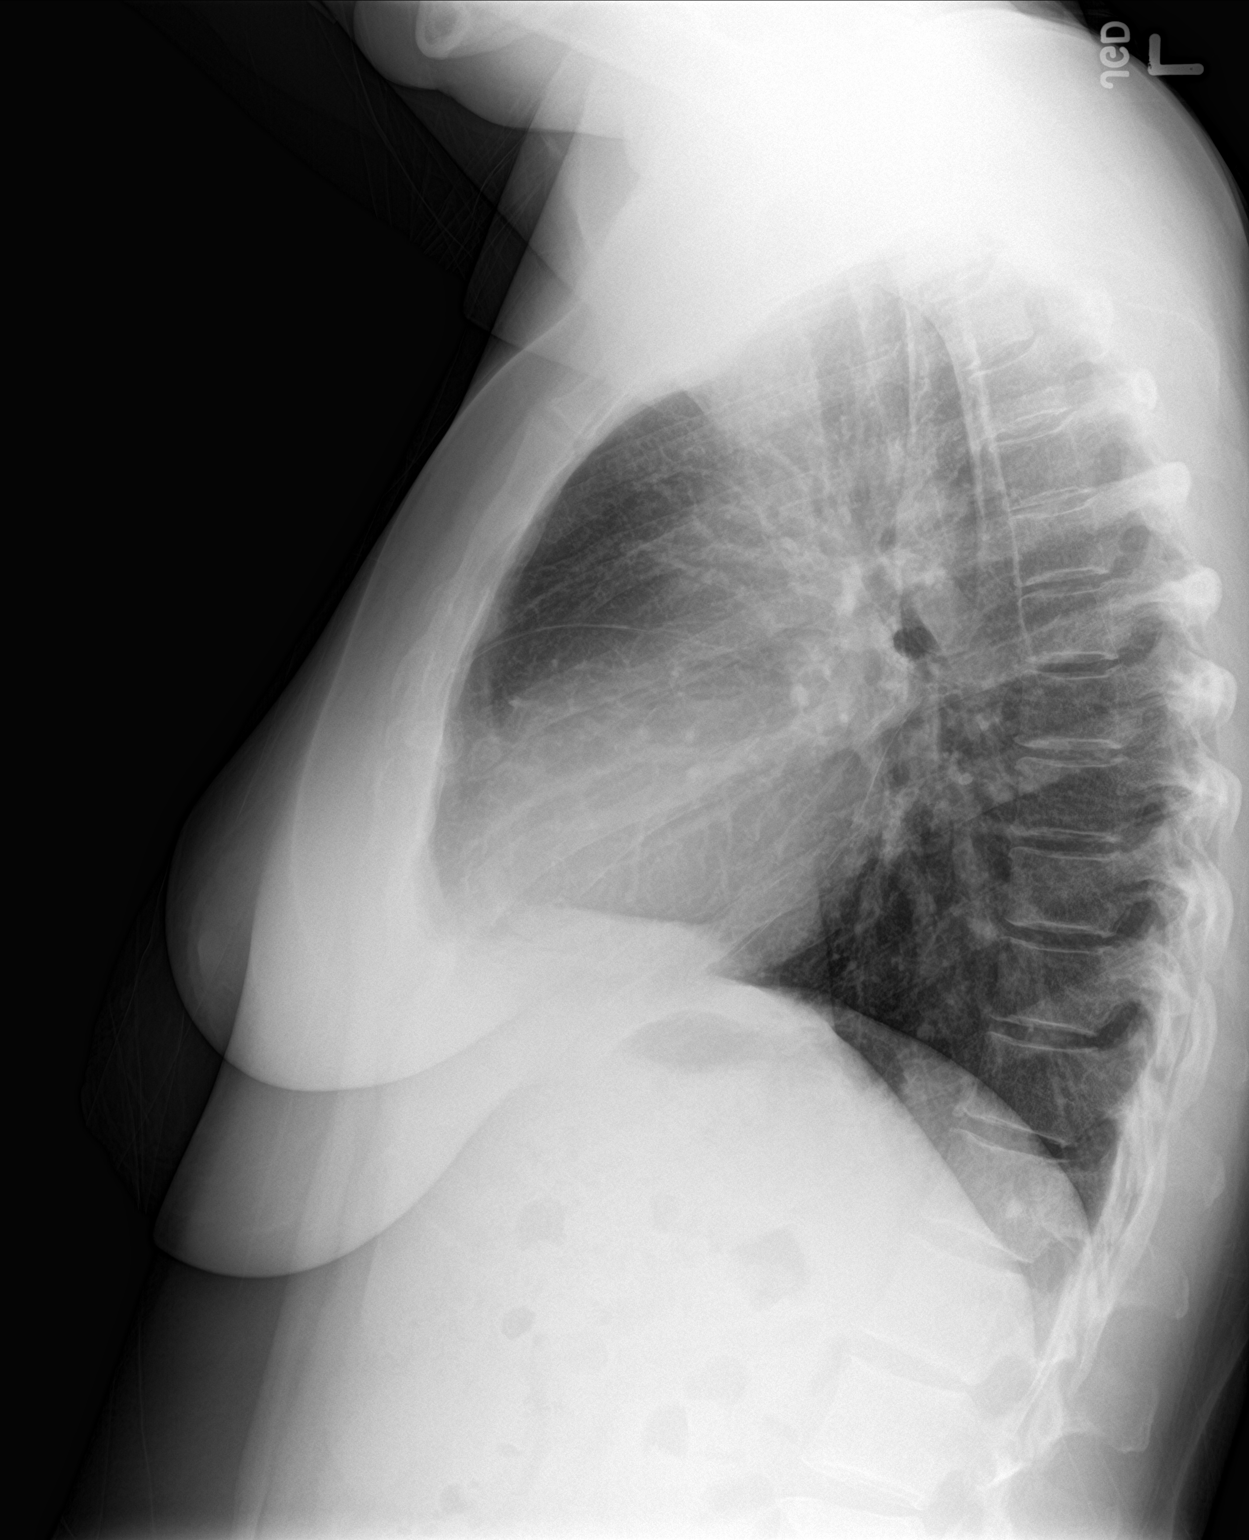

[2 of 2 positions shown; findings below may reference images not displayed]

FINDINGS: The cardiomediastinal silhouette is normal.

There is no focal consolidation or pulmonary edema. There is no
pleural effusion or pneumothorax.

There is no acute osseous abnormality.
IMPRESSION: No radiographic evidence of acute cardiopulmonary process.

## 2024-01-16 MED ORDER — ROSUVASTATIN CALCIUM 10 MG PO TABS
10.0000 mg | ORAL_TABLET | Freq: Every day | ORAL | 1 refills | Status: AC
  Filled 2024-01-16: qty 90, 90d supply, fill #0

## 2024-01-16 MED ORDER — CETIRIZINE HCL 10 MG PO TABS
10.0000 mg | ORAL_TABLET | Freq: Every day | ORAL | 1 refills | Status: AC
  Filled 2024-01-16: qty 30, 30d supply, fill #0

## 2024-01-17 ENCOUNTER — Other Ambulatory Visit: Payer: Self-pay

## 2024-02-06 ENCOUNTER — Ambulatory Visit: Payer: MEDICAID | Admitting: Obstetrics and Gynecology

## 2024-05-06 ENCOUNTER — Ambulatory Visit: Payer: MEDICAID | Admitting: Nurse Practitioner
